# Patient Record
Sex: Male | Born: 1985
Health system: Southern US, Community
[De-identification: ages and names within clinical notes are randomized; demographics above are authoritative.]

## PROBLEM LIST (undated history)

## (undated) DIAGNOSIS — I319 Disease of pericardium, unspecified: Secondary | ICD-10-CM

## (undated) HISTORY — DX: Disease of pericardium, unspecified: I31.9

---

## 2007-10-28 ENCOUNTER — Emergency Department (HOSPITAL_COMMUNITY): Admission: EM | Admit: 2007-10-28 | Discharge: 2007-10-28 | Payer: Self-pay | Admitting: Emergency Medicine

## 2011-05-18 ENCOUNTER — Encounter: Payer: Self-pay | Admitting: Family Medicine

## 2011-05-18 ENCOUNTER — Ambulatory Visit (INDEPENDENT_AMBULATORY_CARE_PROVIDER_SITE_OTHER): Payer: 59 | Admitting: Family Medicine

## 2011-05-18 VITALS — BP 118/80 | HR 82 | Temp 98.1°F | Ht 67.0 in | Wt 211.8 lb

## 2011-05-18 DIAGNOSIS — Z131 Encounter for screening for diabetes mellitus: Secondary | ICD-10-CM

## 2011-05-18 DIAGNOSIS — M722 Plantar fascial fibromatosis: Secondary | ICD-10-CM

## 2011-05-18 DIAGNOSIS — Z13220 Encounter for screening for lipoid disorders: Secondary | ICD-10-CM

## 2011-05-18 NOTE — Progress Notes (Signed)
  Patient Name: John Ray Date of Birth: Dec 25, 1985 Age: 25 y.o. Medical Record Number: [id number] Gender: male  History of Present Illness:  John Ray is a 25 y.o. very pleasant male patient who presents with the following:  Just moved from [place]. Started to get some pain in his foot for aout six months.  Father just had another MI.   Had some concussions from college hockey and lacrosse. At [facility] [employer] - played hockey in [place].   The patient presents with a 6 month long history of heel pain. This is notable for worsening pain first thing in the morning when arising and standing after sitting.   Prior foot or ankle fractures: none Prior operations: none Orthotics or bracing: none Medications: OTC nsaids and tylenol PT or home rehab: none Night splints: no Ice massage: no Ball massage: no  Metatarsal pain: no  His father recently had a 2nd heart attack, and he would like to be checked for some basic lab work including cholesterol.  Past Medical History, Surgical History, Social History, Family History, and Problem List have been reviewed in EHR and updated if relevant.  Review of Systems:  GEN: No acute illnesses, no fevers, chills. GI: No n/v/d, eating normally Pulm: No SOB Interactive and getting along well at home.  Otherwise, ROS is as per the HPI.   Physical Examination: Filed Vitals:   05/18/11 1406  BP: 118/80  Pulse: 82  Temp: 98.1 F (36.7 C)  TempSrc: Oral  Height: 5\' 7"  (1.702 m)  Weight: 211 lb 12.8 oz (96.072 kg)  SpO2: 98%     GEN: WDWN, NAD, Non-toxic, Alert & Oriented x 3 HEENT: Atraumatic, Normocephalic.  Ears and Nose: No external deformity. EXTR: No clubbing/cyanosis/edema NEURO: Normal gait.  PSYCH: Normally interactive. Conversant. Not depressed or anxious appearing.  Calm demeanor.  Pulmonary: Clear throughout bilaterally, no wheezes, crackles, or rhonchi Cardiovascular:  Regular rate and rhythm, no murmurs, gallops, or rubs  Foot exam Echymosis: no Edema: no ROM: full LE B Gait: heel toe, non-antalgic MT pain: no Callus pattern: none Lateral Mall: NT Medial Mall: NT Talus: NT Navicular: NT Calcaneous: NT Metatarsals: NT 5th MT: NT Phalanges: NT Achilles: NT Plantar Fascia: tender, medial along PF. Pain with forced dorsi Fat Pad: NT Peroneals: NT Post Tib: NT Great Toe: Nml motion Ant Drawer: neg Other foot breakdown: none Long arch: preserved - cavus foot Transverse arch: preserved Hindfoot breakdown: none Sensation: intact   Assessment and Plan: 1. Plantar fascia syndrome    2. Screening for lipoid disorders  Lipid panel  3. Screening for diabetes mellitus  Basic metabolic panel    Plantar Fascitis: We reviewed that stretching is critically important to the treatment of PF. Reviewed footwear. Rigid soles have been shown to help with PF. Reviewed rehab of stretching and calf raises.  Could benefit from a corticosteroid injection, orthotics, or other measures if conservative treatment fails.   Refer to the patient instructions sections for details of plan shared with patient.

## 2011-05-18 NOTE — Patient Instructions (Signed)
Please read handouts on Plantar Fascitis.  STRETCHING and Strengthening program critically important.  Strengthening on foot and calf muscles as seen in handout. Calf raises, 2 legged, then 1 legged. Foot massage with tennis ball. Ice massage.  NEEDS TO BE DONE EVERY DAY  Recommended over the counter insoles. (Spenco or Hapad)  A rigid shoe with good arch support helps: Dansko (great), Lelon Frohlich --- for [employer]: Clarks, [place], [employer], [employer]kenstock No easily bendable shoes.

## 2011-05-19 ENCOUNTER — Encounter: Payer: Self-pay | Admitting: Family Medicine

## 2011-05-19 LAB — BASIC METABOLIC PANEL
BUN: 11 mg/dL (ref 6–23)
Chloride: 106 mEq/L (ref 96–112)
Glucose, Bld: 92 mg/dL (ref 70–99)
Potassium: 3.8 mEq/L (ref 3.5–5.3)

## 2011-05-19 LAB — LIPID PANEL
LDL Cholesterol: 71 mg/dL (ref 0–99)
VLDL: 72 mg/dL — ABNORMAL HIGH (ref 0–40)

## 2011-05-23 ENCOUNTER — Encounter: Payer: Self-pay | Admitting: *Deleted

## 2012-01-25 ENCOUNTER — Other Ambulatory Visit: Payer: Self-pay | Admitting: Family Medicine

## 2012-01-25 DIAGNOSIS — E781 Pure hyperglyceridemia: Secondary | ICD-10-CM

## 2012-01-25 DIAGNOSIS — R5383 Other fatigue: Secondary | ICD-10-CM

## 2012-01-30 ENCOUNTER — Other Ambulatory Visit (INDEPENDENT_AMBULATORY_CARE_PROVIDER_SITE_OTHER): Payer: Managed Care, Other (non HMO)

## 2012-01-30 DIAGNOSIS — E781 Pure hyperglyceridemia: Secondary | ICD-10-CM

## 2012-01-30 DIAGNOSIS — R5381 Other malaise: Secondary | ICD-10-CM

## 2012-01-30 LAB — LIPID PANEL
HDL: 40.3 mg/dL (ref >39.00)
Triglycerides: 171 mg/dL — ABNORMAL HIGH (ref 0.0–149.0)

## 2012-01-30 LAB — CBC WITH DIFFERENTIAL/PLATELET
Eosinophils Relative: 2.4 % (ref 0.0–5.0)
MCV: 83.7 fl (ref 78.0–100.0)
Monocytes Absolute: 0.6 10*3/uL (ref 0.1–1.0)
Monocytes Relative: 7.6 % (ref 3.0–12.0)
Neutrophils Relative %: 54.3 % (ref 43.0–77.0)
Platelets: 200 10*3/uL (ref 150.0–400.0)
WBC: 7.5 10*3/uL (ref 4.5–10.5)

## 2012-01-30 LAB — HEPATIC FUNCTION PANEL
ALT: 95 U/L — ABNORMAL HIGH (ref 0–53)
AST: 51 U/L — ABNORMAL HIGH (ref 0–37)
Albumin: 4.5 g/dL (ref 3.5–5.2)
Total Protein: 7.6 g/dL (ref 6.0–8.3)

## 2012-01-30 LAB — BASIC METABOLIC PANEL
GFR: 129.84 mL/min (ref >60.00)
Glucose, Bld: 93 mg/dL (ref 70–99)
Potassium: 4.2 mEq/L (ref 3.5–5.1)
Sodium: 141 mEq/L (ref 135–145)

## 2012-02-06 ENCOUNTER — Encounter: Payer: Self-pay | Admitting: Family Medicine

## 2012-02-06 ENCOUNTER — Ambulatory Visit (INDEPENDENT_AMBULATORY_CARE_PROVIDER_SITE_OTHER): Payer: Managed Care, Other (non HMO) | Admitting: Family Medicine

## 2012-02-06 VITALS — BP 130/84 | HR 86 | Temp 98.2°F | Ht 67.0 in | Wt 213.8 lb

## 2012-02-06 DIAGNOSIS — Z0000 Encounter for general adult medical examination without abnormal findings: Secondary | ICD-10-CM

## 2012-02-06 NOTE — Progress Notes (Signed)
[facility] at [facility] [address] [place] [address] Phone: [phone] Fax: [phone]  Date:  02/06/2012   Name:  John Ray   DOB:  May 18, 1986   MRN:  [id number] Gender: male  Age: 26 y.o.  PCP:  Hannah Beat, MD    Chief Complaint: Annual Exam   History of Present Illness:  John Ray is a 26 y.o. very pleasant male patient who presents with the following:  Preventative Health Maintenance Visit:  Health Maintenance Summary Reviewed and updated, unless pt declines services.  Tobacco History Reviewed. Alcohol: No concerns, no excessive use Exercise Habits: Some activity, rec at least 30 mins 5 times a week - going to [employer], doing some boxing workouts STD concerns: no risk or activity to increase risk Drug Use: None Encouraged self-testicular check  Health Maintenance  Topic Date Due  . Tetanus/tdap  02/15/2003  . Influenza Vaccine  03/27/2012    Labs reviewed with the patient.  Results for orders placed in visit on 01/30/12  LIPID PANEL      Component Value Range   Cholesterol 182  0 - 200 mg/dL   Triglycerides 865.7 (*) 0.0 - 149.0 mg/dL   HDL 84.69  >62.95 mg/dL   VLDL 28.4  0.0 - 13.2 mg/dL   LDL Cholesterol 440 (*) 0 - 99 mg/dL   Total CHOL/HDL Ratio 5    HEPATIC FUNCTION PANEL      Component Value Range   Total Bilirubin 0.8  0.3 - 1.2 mg/dL   Bilirubin, Direct 0.1  0.0 - 0.3 mg/dL   Alkaline Phosphatase 58  39 - 117 U/L   AST 51 (*) 0 - 37 U/L   ALT 95 (*) 0 - 53 U/L   Total Protein 7.6  6.0 - 8.3 g/dL   Albumin 4.5  3.5 - 5.2 g/dL  CBC WITH DIFFERENTIAL      Component Value Range   WBC 7.5  4.5 - 10.5 K/uL   RBC 5.22  4.22 - 5.81 Mil/uL   Hemoglobin 14.5  13.0 - 17.0 g/dL   HCT 10.2  72.5 - 36.6 %   MCV 83.7  78.0 - 100.0 fl   MCHC 33.3  30.0 - 36.0 g/dL   RDW 44.0  34.7 - 42.5 %   Platelets 200.0  150.0 - 400.0 K/uL   Neutrophils Relative 54.3  43.0 - 77.0 %   Lymphocytes  Relative 35.2  12.0 - 46.0 %   Monocytes Relative 7.6  3.0 - 12.0 %   Eosinophils Relative 2.4  0.0 - 5.0 %   Basophils Relative 0.5  0.0 - 3.0 %   Neutro Abs 4.1  1.4 - 7.7 K/uL   Lymphs Abs 2.6  0.7 - 4.0 K/uL   Monocytes Absolute 0.6  0.1 - 1.0 K/uL   Eosinophils Absolute 0.2  0.0 - 0.7 K/uL   Basophils Absolute 0.0  0.0 - 0.1 K/uL  BASIC METABOLIC PANEL      Component Value Range   Sodium 141  135 - 145 mEq/L   Potassium 4.2  3.5 - 5.1 mEq/L   Chloride 106  96 - 112 mEq/L   CO2 30  19 - 32 mEq/L   Glucose, Bld 93  70 - 99 mg/dL   BUN 14  6 - 23 mg/dL   Creatinine, Ser 0.8  0.4 - 1.5 mg/dL   Calcium 9.2  8.4 - 95.6 mg/dL   GFR 387.56  >43.32 mL/min  Has been stressed out a lot. Joined a gym and hitting a bag [employer]    Past Medical History, Surgical History, Social History, Family History, Problem List, Medications, and Allergies have been reviewed and updated if relevant.  No current outpatient prescriptions on file prior to visit.    Review of Systems:  General: Denies fever, chills, sweats. No significant weight loss. Eyes: Denies blurring,significant itching ENT: Denies earache, sore throat, and hoarseness. Cardiovascular: Denies chest pains, palpitations, dyspnea on exertion Respiratory: Denies cough, dyspnea at rest,wheeezing Breast: no concerns about lumps GI: Denies nausea, vomiting, diarrhea, constipation, change in bowel habits, abdominal pain, melena, hematochezia GU: Denies penile discharge, ED, urinary flow / outflow problems. No STD concerns. Musculoskeletal: Denies back pain, joint pain Derm: Denies rash, itching Neuro: Denies  paresthesias, frequent falls, frequent headaches Psych: Denies depression, anxiety. SOME STRESS AT WORK, 28 HOURS A WEEK AT WORK Endocrine: Denies cold intolerance, heat intolerance, polydipsia Heme: Denies enlarged lymph nodes Allergy: No hayfever   Physical Examination: Filed Vitals:   02/06/12 1426  BP: 130/84   Pulse: 86  Temp: 98.2 F (36.8 C)   Filed Vitals:   02/06/12 1426  Height: 5\' 7"  (1.702 m)  Weight: 213 lb 12 oz (96.956 kg)   Body mass index is 33.48 kg/(m^2). Ideal Body Weight: Weight in (lb) to have BMI = 25: 159.3   GEN: well developed, well nourished, no acute distress Eyes: conjunctiva and lids normal, PERRLA, EOMI ENT: TM clear, nares clear, oral exam WNL Neck: supple, no lymphadenopathy, no thyromegaly, no JVD Pulm: clear to auscultation and percussion, respiratory effort normal CV: regular rate and rhythm, S1-S2, no murmur, rub or gallop, no bruits, peripheral pulses normal and symmetric, no cyanosis, clubbing, edema or varicosities GI: soft, non-tender; no hepatosplenomegaly, masses; active bowel sounds all quadrants GU: no hernia, testicular mass, penile discharge Lymph: no cervical, axillary or inguinal adenopathy MSK: gait normal, muscle tone and strength WNL, no joint swelling, effusions, discoloration, crepitus  SKIN: clear, good turgor, color WNL, no rashes, lesions, or ulcerations Neuro: normal mental status, normal strength, sensation, and motion Psych: alert; oriented to person, place and time, normally interactive and not anxious or depressed in appearance.   Assessment and Plan:  1. Routine general medical examination at a health care facility    2. Elevated transaminase level  Hepatitis B core antibody, IgM, Hepatitis B surface antibody, Hepatitis B surface antigen, Hepatitis C antibody   The patient's preventative maintenance and recommended screening tests for an annual wellness exam were reviewed in full today. Brought up to date unless services declined.  Counselled on the importance of diet, exercise, and its role in overall health and mortality. The patient's FH and SH was reviewed, including their home life, tobacco status, and drug and alcohol status.   Elevated LFT's discussed, low risk, but need to rule out hep  Orders Today:  Orders Placed  This Encounter  Procedures  . Hepatitis B core antibody, IgM  . Hepatitis B surface antibody  . Hepatitis B surface antigen  . Hepatitis C antibody    Medications Today: (Includes new updates added during medication reconciliation) No orders of the defined types were placed in this encounter.    Medications Discontinued: There are no discontinued medications.  Labs Results from 02/06/2012 that have returned and recent labs:  Hannah Beat, MD

## 2012-02-07 LAB — HEPATITIS C ANTIBODY: HCV Ab: NEGATIVE

## 2012-02-07 LAB — HEPATITIS B SURFACE ANTIGEN: Hepatitis B Surface Ag: NEGATIVE

## 2013-02-28 ENCOUNTER — Encounter: Payer: Self-pay | Admitting: Family Medicine

## 2013-02-28 ENCOUNTER — Ambulatory Visit (INDEPENDENT_AMBULATORY_CARE_PROVIDER_SITE_OTHER): Payer: Managed Care, Other (non HMO) | Admitting: Family Medicine

## 2013-02-28 VITALS — BP 128/100 | HR 82 | Temp 98.2°F | Ht 67.25 in | Wt 211.5 lb

## 2013-02-28 DIAGNOSIS — Z131 Encounter for screening for diabetes mellitus: Secondary | ICD-10-CM

## 2013-02-28 DIAGNOSIS — Z0000 Encounter for general adult medical examination without abnormal findings: Secondary | ICD-10-CM

## 2013-02-28 DIAGNOSIS — R5381 Other malaise: Secondary | ICD-10-CM

## 2013-02-28 DIAGNOSIS — Z13220 Encounter for screening for lipoid disorders: Secondary | ICD-10-CM

## 2013-02-28 LAB — HEPATIC FUNCTION PANEL
ALT: 61 U/L — ABNORMAL HIGH (ref 0–53)
AST: 34 U/L (ref 0–37)
Alkaline Phosphatase: 58 U/L (ref 39–117)
Bilirubin, Direct: 0 mg/dL (ref 0.0–0.3)
Total Protein: 7.8 g/dL (ref 6.0–8.3)

## 2013-02-28 LAB — CBC WITH DIFFERENTIAL/PLATELET
Basophils Relative: 0.5 % (ref 0.0–3.0)
Eosinophils Relative: 2.3 % (ref 0.0–5.0)
Lymphocytes Relative: 37.6 % (ref 12.0–46.0)
MCV: 82.8 fl (ref 78.0–100.0)
Monocytes Relative: 8.2 % (ref 3.0–12.0)
Neutrophils Relative %: 51.4 % (ref 43.0–77.0)
RBC: 5.41 Mil/uL (ref 4.22–5.81)
WBC: 8.2 10*3/uL (ref 4.5–10.5)

## 2013-02-28 LAB — BASIC METABOLIC PANEL
Calcium: 9.5 mg/dL (ref 8.4–10.5)
Chloride: 101 mEq/L (ref 96–112)
Creatinine, Ser: 0.8 mg/dL (ref 0.4–1.5)
GFR: 121.44 mL/min (ref >60.00)

## 2013-02-28 LAB — LIPID PANEL: Total CHOL/HDL Ratio: 5

## 2013-02-28 NOTE — Progress Notes (Signed)
[facility] at [facility] [address] [place] [address] Phone: [phone] Fax: [phone]  Date:  02/28/2013   Name:  John Ray   DOB:  05/21/1986   MRN:  [id number] Gender: male Age: 27 y.o.  Primary Physician:  Hannah Beat, MD  Evaluating MD: Hannah Beat, MD   Chief Complaint: Annual Exam   History of Present Illness:  John Ray is a 27 y.o. pleasant patient who presents with the following:  CPX:  Wt Readings from Last 3 Encounters:  02/28/13 211 lb 8 oz (95.936 kg)  02/06/12 213 lb 12 oz (96.956 kg)  05/18/11 211 lb 12.8 oz (96.072 kg)   Preventative Health Maintenance Visit:  Health Maintenance Summary Reviewed and updated, unless pt declines services.  Tobacco History Reviewed. Alcohol: No concerns, no excessive use Exercise Habits: Some activity, rec at least 30 mins 5 times a week (about 2 times a month now) STD concerns: no risk or activity to increase risk Drug Use: None Encouraged self-testicular chec   There are no active problems to display for this patient.   Past Medical History  Diagnosis Date  . Pericarditis   . Concussion     x 2 playing college hockey and lacrosse    No past surgical history on file.  History   Social History  . Marital Status: Married    Spouse Name: N/A    Number of Children: N/A  . Years of Education: N/A   Occupational History  . Not on file.   Social History Main Topics  . Smoking status: Never Smoker   . Smokeless tobacco: Former Neurosurgeon  . Alcohol Use: Yes  . Drug Use: No  . Sexual Activity: Not on file   Other Topics Concern  . Not on file   Social History Narrative  . No narrative on file    No family history on file.  No Known Allergies  Medication list has been reviewed and updated.  No outpatient prescriptions prior to visit.   No facility-administered medications prior to visit.    Review of Systems:   General:  Denies fever, chills, sweats. No significant weight loss. Eyes: Denies blurring,significant itching ENT: Denies earache, sore throat, and hoarseness. Cardiovascular: Denies chest pains, palpitations, dyspnea on exertion Respiratory: Denies cough, dyspnea at rest,wheeezing Breast: no concerns about lumps GI: Denies nausea, vomiting, diarrhea, constipation, change in bowel habits, abdominal pain, melena, hematochezia GU: Denies penile discharge, ED, urinary flow / outflow problems. No STD concerns. Musculoskeletal: Denies back pain, joint pain Derm: Denies rash, itching Neuro: Denies  paresthesias, frequent falls, frequent headaches Psych: Denies depression, anxiety Endocrine: Denies cold intolerance, heat intolerance, polydipsia Heme: Denies enlarged lymph nodes Allergy: No hayfever   Physical Examination: BP 128/100  Pulse 82  Temp(Src) 98.2 F (36.8 C) (Oral)  Ht 5' 7.25" (1.708 m)  Wt 211 lb 8 oz (95.936 kg)  BMI 32.89 kg/m2  Ideal Body Weight: Weight in (lb) to have BMI = 25: 160.5   Wt Readings from Last 3 Encounters:  02/28/13 211 lb 8 oz (95.936 kg)  02/06/12 213 lb 12 oz (96.956 kg)  05/18/11 211 lb 12.8 oz (96.072 kg)    GEN: well developed, well nourished, no acute distress Eyes: conjunctiva and lids normal, PERRLA, EOMI ENT: TM clear, nares clear, oral exam WNL Neck: supple, no lymphadenopathy, no thyromegaly, no JVD Pulm: clear to auscultation and percussion, respiratory effort normal CV: regular rate and rhythm, S1-S2, no murmur, rub or  gallop, no bruits, peripheral pulses normal and symmetric, no cyanosis, clubbing, edema or varicosities Chest: no scars, masses GI: soft, non-tender; no hepatosplenomegaly, masses; active bowel sounds all quadrants GU: no hernia, testicular mass, penile discharge Lymph: no cervical, axillary or inguinal adenopathy MSK: gait normal, muscle tone and strength WNL, no joint swelling, effusions, discoloration, crepitus  SKIN:  clear, good turgor, color WNL, no rashes, lesions, or ulcerations Neuro: normal mental status, normal strength, sensation, and motion Psych: alert; oriented to person, place and time, normally interactive and not anxious or depressed in appearance.   Assessment and Plan:  Routine general medical examination at a health care facility  Screening for lipoid disorders - Plan: Lipid panel  Screening for diabetes mellitus - Plan: Basic metabolic panel  Other malaise and fatigue - Plan: CBC with Differential, Hepatic function panel  The patient's preventative maintenance and recommended screening tests for an annual wellness exam were reviewed in full today. Brought up to date unless services declined.  Counselled on the importance of diet, exercise, and its role in overall health and mortality. The patient's FH and SH was reviewed, including their home life, tobacco status, and drug and alcohol status.   Doing well, work on more exercise.  Orders Today:  Orders Placed This Encounter  Procedures  . Basic metabolic panel  . CBC with Differential  . Hepatic function panel  . Lipid panel    Updated Medication List: (Includes new medications, updates to list, dose adjustments) No orders of the defined types were placed in this encounter.    Medications Discontinued: There are no discontinued medications.    Signed, Elpidio Galea. Aqeel Norgaard, MD 02/28/2013 2:26 PM

## 2013-03-01 ENCOUNTER — Encounter: Payer: Self-pay | Admitting: *Deleted

## 2013-12-06 ENCOUNTER — Encounter: Payer: Self-pay | Admitting: Family Medicine

## 2013-12-06 ENCOUNTER — Ambulatory Visit (INDEPENDENT_AMBULATORY_CARE_PROVIDER_SITE_OTHER): Payer: Managed Care, Other (non HMO) | Admitting: Family Medicine

## 2013-12-06 VITALS — BP 138/80 | HR 72 | Temp 98.1°F | Ht 67.5 in | Wt 215.5 lb

## 2013-12-06 DIAGNOSIS — M25532 Pain in left wrist: Secondary | ICD-10-CM

## 2013-12-06 DIAGNOSIS — M25539 Pain in unspecified wrist: Secondary | ICD-10-CM

## 2013-12-06 DIAGNOSIS — G5620 Lesion of ulnar nerve, unspecified upper limb: Secondary | ICD-10-CM

## 2013-12-06 NOTE — Progress Notes (Signed)
[address] [place] [id number] Phone: [phone] Fax: [phone]  Patient ID: John NoseChristopher Michael Kirsten MRN: [id number], DOB: July 08, 1985, 28 y.o. Date of Encounter: 12/06/2013  Primary Physician:  Hannah BeatSpencer Christoph Copelan, MD   Chief Complaint: Hand Pain   Subjective:   History of Present Illness:  John NoseChristopher Michael Ray is a 28 y.o. very pleasant male patient who presents with the following:  Left hand has been hurting. Was on vacation last week and playing around with [place] in the [place]. Did not lift last week. Has not played lacrosse.  Left lateral hand tingling, some up arm. He has not been having any calf pain in his neck, shoulder blade, or trapezius region. He has been having some tingling and altered sensations on the 4th and 5th digits on the LEFT hand. He is also noticed that his grip strength is decreased on the LEFT compared to the RIGHT. He was surfing and rodding of the keyboard quite a bit with his kids last week. This all happened before the initiation of his symptoms.  He has never had any kind of similar sensation or any significant history of neck or problems at this elbow or wrist other than typical aches and pains that go along with hockey and lacrosse.  L arm.  Ulnar nerve, guyon's canal.  Past Medical History, Surgical History, Social History, Family History, Problem List, Medications, and Allergies have been reviewed and updated if relevant.  Review of Systems:  GEN: No fevers, chills. Nontoxic. Primarily MSK c/o today. MSK: Detailed in the HPI GI: tolerating PO intake without difficulty Neuro: as above. Otherwise the pertinent positives of the ROS are noted above.   Objective:   Physical Examination: BP 138/80  Pulse 72  Temp(Src) 98.1 F (36.7 C) (Oral)  Ht 5' 7.5" (1.715 m)  Wt 215 lb 8 oz (97.75 kg)  BMI 33.23 kg/m2   GEN: WDWN, NAD, Non-toxic, Alert & Oriented x 3 HEENT: Atraumatic, Normocephalic.  Ears and Nose: No external  deformity. EXTR: No clubbing/cyanosis/edema NEURO: Normal gait.  PSYCH: Normally interactive. Conversant. Not depressed or anxious appearing.  Calm demeanor.    Full range of motion at the elbow. Strength is preserved in all directions. Full range of motion at the shoulder.  CERVICAL SPINE EXAM Range of motion: Flexion, extension, lateral bending, and rotation: full Pain with terminal motion: no Spinous Processes: NT SCM: NT Upper paracervical muscles: nt Upper traps: NT  At the hand and wrist on the LEFT the patient is nontender throughout all fingers, nontender at the MCP joint, nontender at the metacarpals. He is nontender along the ulna and radius. Axial loading does cause pain. Axial deviation towards the radial side causes minimal pain, and axial radiation towards the ulnar side does cause him pain. His grip is diminished on the LEFT compared to the RIGHT. Sensation is slightly altered to pinprick on the 4th and 5th. Deep tendon reflexes are intact and 2+.  Minimally to non-provoke a little symptoms at the elbow when Tinel's at the elbow is attempted.  Radiology: No results found.  Assessment & Plan:   Ulnar neuropathy at wrist  Wrist pain, left  He certainly has some wrist pain, he may have had a wrist sprain on some level. It is possible that a TFCC could have been injured as well, and the patient also has been highly active playing lacrosse and hockey for years, so he may have some negative changes in his wrist.  Nevertheless, I think that his ulnar  nerve is likely being compressed in his wrist and this is probably driving his symptoms. I am going to place him in a cockup wrist splint for the next 3 weeks. Aleve 440 mg p.o. B.i.d.  If he is still having persistent symptoms, then additional workup to assess the source of the nerve impairment would make sense.  Follow-up: No Follow-up on file. Unless noted above, the patient is to follow-up if symptoms worsen. Red flags were  reviewed with the patient.  Signed,  Elpidio GaleaSpencer T. Viola Kinnick, MD, CAQ Sports Medicine   Discontinued Medications   No medications on file   Current Medications at Discharge:   Medication List    Notice As of 12/06/2013 11:59 PM   You have not been prescribed any medications.

## 2013-12-06 NOTE — Progress Notes (Signed)
Pre visit review using our clinic review tool, if applicable. No additional management support is needed unless otherwise documented below in the visit note. 

## 2014-02-10 ENCOUNTER — Ambulatory Visit (INDEPENDENT_AMBULATORY_CARE_PROVIDER_SITE_OTHER): Payer: Managed Care, Other (non HMO) | Admitting: Internal Medicine

## 2014-02-10 ENCOUNTER — Encounter: Payer: Self-pay | Admitting: Internal Medicine

## 2014-02-10 VITALS — BP 124/82 | HR 68 | Temp 98.0°F | Ht 67.5 in | Wt 215.5 lb

## 2014-02-10 DIAGNOSIS — L678 Other hair color and hair shaft abnormalities: Secondary | ICD-10-CM

## 2014-02-10 DIAGNOSIS — L738 Other specified follicular disorders: Secondary | ICD-10-CM

## 2014-02-10 DIAGNOSIS — E785 Hyperlipidemia, unspecified: Secondary | ICD-10-CM

## 2014-02-10 DIAGNOSIS — Z0000 Encounter for general adult medical examination without abnormal findings: Secondary | ICD-10-CM

## 2014-02-10 DIAGNOSIS — L739 Follicular disorder, unspecified: Secondary | ICD-10-CM

## 2014-02-10 DIAGNOSIS — B353 Tinea pedis: Secondary | ICD-10-CM

## 2014-02-10 LAB — LIPID PANEL
CHOL/HDL RATIO: 7
CHOLESTEROL: 188 mg/dL (ref 0–200)
HDL: 26.7 mg/dL — ABNORMAL LOW (ref >39.00)
NONHDL: 161.3
TRIGLYCERIDES: 696 mg/dL — AB (ref 0.0–149.0)
VLDL: 139.2 mg/dL — AB (ref 0.0–40.0)

## 2014-02-10 LAB — COMPREHENSIVE METABOLIC PANEL
ALK PHOS: 59 U/L (ref 39–117)
ALT: 60 U/L — ABNORMAL HIGH (ref 0–53)
AST: 32 U/L (ref 0–37)
Albumin: 4.2 g/dL (ref 3.5–5.2)
BILIRUBIN TOTAL: 0.5 mg/dL (ref 0.2–1.2)
BUN: 14 mg/dL (ref 6–23)
CO2: 28 mEq/L (ref 19–32)
Calcium: 9 mg/dL (ref 8.4–10.5)
Chloride: 104 mEq/L (ref 96–112)
Creatinine, Ser: 0.9 mg/dL (ref 0.4–1.5)
GFR: 101.41 mL/min (ref >60.00)
GLUCOSE: 86 mg/dL (ref 70–99)
Potassium: 4.2 mEq/L (ref 3.5–5.1)
SODIUM: 139 meq/L (ref 135–145)
TOTAL PROTEIN: 7.1 g/dL (ref 6.0–8.3)

## 2014-02-10 LAB — CBC
HEMATOCRIT: 43.5 % (ref 39.0–52.0)
Hemoglobin: 14.6 g/dL (ref 13.0–17.0)
MCHC: 33.6 g/dL (ref 30.0–36.0)
MCV: 82.6 fl (ref 78.0–100.0)
Platelets: 205 10*3/uL (ref 150.0–400.0)
RBC: 5.27 Mil/uL (ref 4.22–5.81)
RDW: 12.7 % (ref 11.5–15.5)
WBC: 6.5 10*3/uL (ref 4.0–10.5)

## 2014-02-10 LAB — LDL CHOLESTEROL, DIRECT: Direct LDL: 83.6 mg/dL

## 2014-02-10 MED ORDER — MUPIROCIN 2 % EX OINT: 1.0000 "application " | TOPICAL_OINTMENT | Freq: Two times a day (BID) | CUTANEOUS | Status: DC

## 2014-02-10 NOTE — Progress Notes (Signed)
Subjective:    Patient ID: John Ray, male    DOB: [birth year], 28 y.o.   MRN: [id number]  HPI  Pt presents to the clinic today for his annual physical.  Flu: never Tetanus: < 10 years ago Dentist: Biannually  Review of Systems      Past Medical History  Diagnosis Date  . Pericarditis   . Concussion     x 2 playing college hockey and lacrosse    No current outpatient prescriptions on file.   No current facility-administered medications for this visit.    No Known Allergies  No family history on file.  History   Social History  . Marital Status: Married    Spouse Name: N/A    Number of Children: N/A  . Years of Education: N/A   Occupational History  . Not on file.   Social History Main Topics  . Smoking status: Never Smoker   . Smokeless tobacco: Former [employer]surgeon[employer]  . Alcohol Use: Yes  . Drug Use: No  . Sexual Activity: Not on file   Other Topics Concern  . Not on file   Social History Narrative  . No narrative on file     Constitutional: Denies fever, malaise, fatigue, headache or abrupt weight changes.  HEENT: Denies eye pain, eye redness, ear pain, ringing in the ears, wax buildup, runny nose, nasal congestion, bloody nose, or sore throat. Respiratory: Denies difficulty breathing, shortness of breath, cough or sputum production.   Cardiovascular: Denies chest pain, chest tightness, palpitations or swelling in the hands or feet.  Gastrointestinal: Denies abdominal pain, bloating, constipation, diarrhea or blood in the stool.  GU: Denies urgency, frequency, pain with urination, burning sensation, blood in urine, odor or discharge. Musculoskeletal: Denies decrease in range of motion, difficulty with gait, muscle pain or joint pain and swelling.  Skin: Pt reports rash on neck and bottom of feet. Denies lesions or ulcercations.  Neurological: Denies dizziness, difficulty with memory, difficulty with speech or problems with balance and  coordination.   No other specific complaints in a complete review of systems (except as listed in HPI above).  Objective:   Physical Exam   BP 124/82  Pulse 68  Temp(Src) 98 F (36.7 C) (Oral)  Ht 5' 7.5" (1.715 m)  Wt 215 lb 8 oz (97.75 kg)  BMI 33.23 kg/m2  SpO2 98% Wt Readings from Last 3 Encounters:  02/10/14 215 lb 8 oz (97.75 kg)  12/06/13 215 lb 8 oz (97.75 kg)  02/28/13 211 lb 8 oz (95.936 kg)    General: Appears his stated age, well developed, well nourished in NAD. Skin: Warm, dry and intact. Folliculitis noted on anterior neck. Fungal infection noted on plantar aspect of bilateral feet, L>R. HEENT: Head: normal shape and size; Eyes: sclera white, no icterus, conjunctiva pink; Ears: Tm's gray and intact, normal light reflex; Throat/Mouth: Teeth present, mucosa pink and moist, no exudate, lesions or ulcerations noted.  Neck:  Neck supple, trachea midline. No masses, lumps or thyromegaly present.  Cardiovascular: Normal rate and rhythm. S1,S2 noted.  No murmur, rubs or gallops noted. No JVD or BLE edema. No carotid bruits noted. Pulmonary/Chest: Normal effort and positive vesicular breath sounds. No respiratory distress. No wheezes, rales or ronchi noted.  Abdomen: Soft and nontender. Normal bowel sounds, no bruits noted. No distention or masses noted. Liver, spleen and kidneys non palpable. Musculoskeletal: Normal range of motion. No difficulty with gait.  Neurological: Alert and oriented. Cranial nerves II-XII grossly intact.  Psychiatric: Mood and affect normal. Behavior is normal. Judgment and thought content normal.     BMET    Component Value Date/Time   NA 137 02/28/2013 1449   K 4.0 02/28/2013 1449   CL 101 02/28/2013 1449   CO2 32 02/28/2013 1449   GLUCOSE 88 02/28/2013 1449   BUN 10 02/28/2013 1449   CREATININE 0.8 02/28/2013 1449   CREATININE 0.82 05/18/2011 1456   CALCIUM 9.5 02/28/2013 1449    Lipid Panel     Component Value Date/Time   CHOL 178 02/28/2013 1449     TRIG 164.0* 02/28/2013 1449   HDL 35.80* 02/28/2013 1449   CHOLHDL 5 02/28/2013 1449   VLDL 32.8 02/28/2013 1449   LDLCALC 109* 02/28/2013 1449    CBC    Component Value Date/Time   WBC 8.2 02/28/2013 1449   RBC 5.41 02/28/2013 1449   HGB 14.9 02/28/2013 1449   HCT 44.8 02/28/2013 1449   PLT 237.0 02/28/2013 1449   MCV 82.8 02/28/2013 1449   MCHC 33.3 02/28/2013 1449   RDW 12.3 02/28/2013 1449   LYMPHSABS 3.1 02/28/2013 1449   MONOABS 0.7 02/28/2013 1449   EOSABS 0.2 02/28/2013 1449   BASOSABS 0.0 02/28/2013 1449    Hgb A1C No results found for this basename: HGBA1C        Assessment & Plan:   Preventative Health Maintenance:  Will obtain CBC, CMET and lipid profile today Discussed flu vaccine for this fall- he declines Wellness form filled out for work  Folliculitis:  Advised him to hold off shaving for a few days if he can Warm compresses to the area TID eRx for Bactroban BID prn  Advised him to use good shaving cream and sharp razor every time he shaves  Tinea Pedis:  He would like to hold off on prescription cream for now He would like to try OTC stuff first  RTC in 1 year or sooner if needed

## 2014-02-10 NOTE — Patient Instructions (Addendum)
Preventive Care for Adults A healthy lifestyle and preventive care can promote health and wellness. Preventive health guidelines for men include the following key practices:  A routine yearly physical is a good way to check with your health care provider about your health and preventative screening. It is a chance to share any concerns and updates on your health and to receive a thorough exam.  Visit your dentist for a routine exam and preventative care every 6 months. Brush your teeth twice a day and floss once a day. Good oral hygiene prevents tooth decay and gum disease.  The frequency of eye exams is based on your age, health, family medical history, use of contact lenses, and other factors. Follow your health care provider's recommendations for frequency of eye exams.  Eat a healthy diet. Foods such as vegetables, fruits, whole grains, low-fat dairy products, and lean protein foods contain the nutrients you need without too many calories. Decrease your intake of foods high in solid fats, added sugars, and salt. Eat the right amount of calories for you.Get information about a proper diet from your health care provider, if necessary.  Regular physical exercise is one of the most important things you can do for your health. Most adults should get at least 150 minutes of moderate-intensity exercise (any activity that increases your heart rate and causes you to sweat) each week. In addition, most adults need muscle-strengthening exercises on 2 or more days a week.  Maintain a healthy weight. The body mass index (BMI) is a screening tool to identify possible weight problems. It provides an estimate of body fat based on height and weight. Your health care provider can find your BMI and can help you achieve or maintain a healthy weight.For adults 20 years and older:  A BMI below 18.5 is considered underweight.  A BMI of 18.5 to 24.9 is normal.  A BMI of 25 to 29.9 is considered overweight.  A BMI  of 30 and above is considered obese.  Maintain normal blood lipids and cholesterol levels by exercising and minimizing your intake of saturated fat. Eat a balanced diet with plenty of fruit and vegetables. Blood tests for lipids and cholesterol should begin at age 50 and be repeated every 5 years. If your lipid or cholesterol levels are high, you are over 50, or you are at high risk for heart disease, you may need your cholesterol levels checked more frequently.Ongoing high lipid and cholesterol levels should be treated with medicines if diet and exercise are not working.  If you smoke, find out from your health care provider how to quit. If you do not use tobacco, do not start.  Lung cancer screening is recommended for adults aged 73-80 years who are at high risk for developing lung cancer because of a history of smoking. A yearly low-dose CT scan of the lungs is recommended for people who have at least a 30-pack-year history of smoking and are a current smoker or have quit within the past 15 years. A pack year of smoking is smoking an average of 1 pack of cigarettes a day for 1 year (for example: 1 pack a day for 30 years or 2 packs a day for 15 years). Yearly screening should continue until the smoker has stopped smoking for at least 15 years. Yearly screening should be stopped for people who develop a health problem that would prevent them from having lung cancer treatment.  If you choose to drink alcohol, do not have more than  2 drinks per day. One drink is considered to be 12 ounces (355 mL) of beer, 5 ounces (148 mL) of wine, or 1.5 ounces (44 mL) of liquor.  Avoid use of street drugs. Do not share needles with anyone. Ask for help if you need support or instructions about stopping the use of drugs.  High blood pressure causes heart disease and increases the risk of stroke. Your blood pressure should be checked at least every 1-2 years. Ongoing high blood pressure should be treated with  medicines, if weight loss and exercise are not effective.  If you are 45-79 years old, ask your health care provider if you should take aspirin to prevent heart disease.  Diabetes screening involves taking a blood sample to check your fasting blood sugar level. This should be done once every 3 years, after age 45, if you are within normal weight and without risk factors for diabetes. Testing should be considered at a younger age or be carried out more frequently if you are overweight and have at least 1 risk factor for diabetes.  Colorectal cancer can be detected and often prevented. Most routine colorectal cancer screening begins at the age of 50 and continues through age 75. However, your health care provider may recommend screening at an earlier age if you have risk factors for colon cancer. On a yearly basis, your health care provider may provide home test kits to check for hidden blood in the stool. Use of a small camera at the end of a tube to directly examine the colon (sigmoidoscopy or colonoscopy) can detect the earliest forms of colorectal cancer. Talk to your health care provider about this at age 50, when routine screening begins. Direct exam of the colon should be repeated every 5-10 years through age 75, unless early forms of precancerous polyps or small growths are found.  People who are at an increased risk for hepatitis B should be screened for this virus. You are considered at high risk for hepatitis B if:  You were born in a country where hepatitis B occurs often. Talk with your health care provider about which countries are considered high risk.  Your parents were born in a high-risk country and you have not received a shot to protect against hepatitis B (hepatitis B vaccine).  You have HIV or AIDS.  You use needles to inject street drugs.  You live with, or have sex with, someone who has hepatitis B.  You are a man who has sex with other men (MSM).  You get hemodialysis  treatment.  You take certain medicines for conditions such as cancer, organ transplantation, and autoimmune conditions.  Hepatitis C blood testing is recommended for all people born from 1945 through 1965 and any individual with known risks for hepatitis C.  Practice safe sex. Use condoms and avoid high-risk sexual practices to reduce the spread of sexually transmitted infections (STIs). STIs include gonorrhea, chlamydia, syphilis, trichomonas, herpes, HPV, and human immunodeficiency virus (HIV). Herpes, HIV, and HPV are viral illnesses that have no cure. They can result in disability, cancer, and death.  If you are at risk of being infected with HIV, it is recommended that you take a prescription medicine daily to prevent HIV infection. This is called preexposure prophylaxis (PrEP). You are considered at risk if:  You are a man who has sex with other men (MSM) and have other risk factors.  You are a heterosexual man, are sexually active, and are at increased risk for HIV infection.    You take drugs by injection.  You are sexually active with a partner who has HIV.  Talk with your health care provider about whether you are at high risk of being infected with HIV. If you choose to begin PrEP, you should first be tested for HIV. You should then be tested every 3 months for as long as you are taking PrEP.  A one-time screening for abdominal aortic aneurysm (AAA) and surgical repair of large AAAs by ultrasound are recommended for men ages 32 to 67 years who are current or former smokers.  Healthy men should no longer receive prostate-specific antigen (PSA) blood tests as part of routine cancer screening. Talk with your health care provider about prostate cancer screening.  Testicular cancer screening is not recommended for adult males who have no symptoms. Screening includes self-exam, a health care provider exam, and other screening tests. Consult with your health care provider about any symptoms  you have or any concerns you have about testicular cancer.  Use sunscreen. Apply sunscreen liberally and repeatedly throughout the day. You should seek shade when your shadow is shorter than you. Protect yourself by wearing long sleeves, pants, a wide-brimmed hat, and sunglasses year round, whenever you are outdoors.  Once a month, do a whole-body skin exam, using a mirror to look at the skin on your back. Tell your health care provider about new moles, moles that have irregular borders, moles that are larger than a pencil eraser, or moles that have changed in shape or color.  Stay current with required vaccines (immunizations).  Influenza vaccine. All adults should be immunized every year.  Tetanus, diphtheria, and acellular pertussis (Td, Tdap) vaccine. An adult who has not previously received Tdap or who does not know his vaccine status should receive 1 dose of Tdap. This initial dose should be followed by tetanus and diphtheria toxoids (Td) booster doses every 10 years. Adults with an unknown or incomplete history of completing a 3-dose immunization series with Td-containing vaccines should begin or complete a primary immunization series including a Tdap dose. Adults should receive a Td booster every 10 years.  Varicella vaccine. An adult without evidence of immunity to varicella should receive 2 doses or a second dose if he has previously received 1 dose.  Human papillomavirus (HPV) vaccine. Males aged 68-21 years who have not received the vaccine previously should receive the 3-dose series. Males aged 22-26 years may be immunized. Immunization is recommended through the age of 6 years for any male who has sex with males and did not get any or all doses earlier. Immunization is recommended for any person with an immunocompromised condition through the age of 49 years if he did not get any or all doses earlier. During the 3-dose series, the second dose should be obtained 4-8 weeks after the first  dose. The third dose should be obtained 24 weeks after the first dose and 16 weeks after the second dose.  Zoster vaccine. One dose is recommended for adults aged 50 years or older unless certain conditions are present.  Measles, mumps, and rubella (MMR) vaccine. Adults born before [birth year] generally are considered immune to measles and mumps. Adults born in [birth year] or later should have 1 or more doses of MMR vaccine unless there is a contraindication to the vaccine or there is laboratory evidence of immunity to each of the three diseases. A routine second dose of MMR vaccine should be obtained at least 28 days after the first dose for students attending postsecondary  schools, health care workers, or international travelers. People who received inactivated measles vaccine or an unknown type of measles vaccine during 1963-1967 should receive 2 doses of MMR vaccine. People who received inactivated mumps vaccine or an unknown type of mumps vaccine before 1979 and are at high risk for mumps infection should consider immunization with 2 doses of MMR vaccine. Unvaccinated health care workers born before 1957 who lack laboratory evidence of measles, mumps, or rubella immunity or laboratory confirmation of disease should consider measles and mumps immunization with 2 doses of MMR vaccine or rubella immunization with 1 dose of MMR vaccine.  Pneumococcal 13-valent conjugate (PCV13) vaccine. When indicated, a person who is uncertain of his immunization history and has no record of immunization should receive the PCV13 vaccine. An adult aged 19 years or older who has certain medical conditions and has not been previously immunized should receive 1 dose of PCV13 vaccine. This PCV13 should be followed with a dose of pneumococcal polysaccharide (PPSV23) vaccine. The PPSV23 vaccine dose should be obtained at least 8 weeks after the dose of PCV13 vaccine. An adult aged 19 years or older who has certain medical conditions and  previously received 1 or more doses of PPSV23 vaccine should receive 1 dose of PCV13. The PCV13 vaccine dose should be obtained 1 or more years after the last PPSV23 vaccine dose.  Pneumococcal polysaccharide (PPSV23) vaccine. When PCV13 is also indicated, PCV13 should be obtained first. All adults aged 65 years and older should be immunized. An adult younger than age 65 years who has certain medical conditions should be immunized. Any person who resides in a nursing home or long-term care facility should be immunized. An adult smoker should be immunized. People with an immunocompromised condition and certain other conditions should receive both PCV13 and PPSV23 vaccines. People with human immunodeficiency virus (HIV) infection should be immunized as soon as possible after diagnosis. Immunization during chemotherapy or radiation therapy should be avoided. Routine use of PPSV23 vaccine is not recommended for American Indians, [place] Natives, or people younger than 65 years unless there are medical conditions that require PPSV23 vaccine. When indicated, people who have unknown immunization and have no record of immunization should receive PPSV23 vaccine. One-time revaccination 5 years after the first dose of PPSV23 is recommended for people aged 19-64 years who have chronic kidney failure, nephrotic syndrome, asplenia, or immunocompromised conditions. People who received 1-2 doses of PPSV23 before age 65 years should receive another dose of PPSV23 vaccine at age 65 years or later if at least 5 years have passed since the previous dose. Doses of PPSV23 are not needed for people immunized with PPSV23 at or after age 65 years.  Meningococcal vaccine. Adults with asplenia or persistent complement component deficiencies should receive 2 doses of quadrivalent meningococcal conjugate (MenACWY-D) vaccine. The doses should be obtained at least 2 months apart. Microbiologists working with certain meningococcal bacteria,  military recruits, people at risk during an outbreak, and people who travel to or live in countries with a high rate of meningitis should be immunized. A first-year college student up through age 21 years who is living in a residence hall should receive a dose if he did not receive a dose on or after his 16th birthday. Adults who have certain high-risk conditions should receive one or more doses of vaccine.  Hepatitis A vaccine. Adults who wish to be protected from this disease, have certain high-risk conditions, work with hepatitis A-infected animals, work in hepatitis A research labs, or   travel to or work in countries with a high rate of hepatitis A should be immunized. Adults who were previously unvaccinated and who anticipate close contact with an international adoptee during the first 60 days after arrival in the [place] from a country with a high rate of hepatitis A should be immunized.  Hepatitis B vaccine. Adults should be immunized if they wish to be protected from this disease, have certain high-risk conditions, may be exposed to blood or other infectious body fluids, are household contacts or sex partners of hepatitis B positive people, are clients or workers in certain care facilities, or travel to or work in countries with a high rate of hepatitis B.  Haemophilus influenzae type b (Hib) vaccine. A previously unvaccinated person with asplenia or sickle cell disease or having a scheduled splenectomy should receive 1 dose of Hib vaccine. Regardless of previous immunization, a recipient of a hematopoietic stem cell transplant should receive a 3-dose series 6-12 months after his successful transplant. Hib vaccine is not recommended for adults with HIV infection. Preventive Service / Frequency Ages 52 to 17  Blood pressure check.** / Every 1 to 2 years.  Lipid and cholesterol check.** / Every 5 years beginning at age 69.  Hepatitis C blood test.** / For any individual with known risks for  hepatitis C.  Skin self-exam. / Monthly.  Influenza vaccine. / Every year.  Tetanus, diphtheria, and acellular pertussis (Tdap, Td) vaccine.** / Consult your health care provider. 1 dose of Td every 10 years.  Varicella vaccine.** / Consult your health care provider.  HPV vaccine. / 3 doses over 6 months, if 72 or younger.  Measles, mumps, rubella (MMR) vaccine.** / You need at least 1 dose of MMR if you were born in 1957 or later. You may also need a second dose.  Pneumococcal 13-valent conjugate (PCV13) vaccine.** / Consult your health care provider.  Pneumococcal polysaccharide (PPSV23) vaccine.** / 1 to 2 doses if you smoke cigarettes or if you have certain conditions.  Meningococcal vaccine.** / 1 dose if you are age 35 to 60 years and a Market researcher living in a residence hall, or have one of several medical conditions. You may also need additional booster doses.  Hepatitis A vaccine.** / Consult your health care provider.  Hepatitis B vaccine.** / Consult your health care provider.  Haemophilus influenzae type b (Hib) vaccine.** / Consult your health care provider. Ages 35 to 8  Blood pressure check.** / Every 1 to 2 years.  Lipid and cholesterol check.** / Every 5 years beginning at age 57.  Lung cancer screening. / Every year if you are aged 44-80 years and have a 30-pack-year history of smoking and currently smoke or have quit within the past 15 years. Yearly screening is stopped once you have quit smoking for at least 15 years or develop a health problem that would prevent you from having lung cancer treatment.  Fecal occult blood test (FOBT) of stool. / Every year beginning at age 55 and continuing until age 73. You may not have to do this test if you get a colonoscopy every 10 years.  Flexible sigmoidoscopy** or colonoscopy.** / Every 5 years for a flexible sigmoidoscopy or every 10 years for a colonoscopy beginning at age 28 and continuing until age  1.  Hepatitis C blood test.** / For all people born from [birth year] through 1965 and any individual with known risks for hepatitis C.  Skin self-exam. / Monthly.  Influenza vaccine. / Every  year.  Tetanus, diphtheria, and acellular pertussis (Tdap/Td) vaccine.** / Consult your health care provider. 1 dose of Td every 10 years.  Varicella vaccine.** / Consult your health care provider.  Zoster vaccine.** / 1 dose for adults aged 25 years or older.  Measles, mumps, rubella (MMR) vaccine.** / You need at least 1 dose of MMR if you were born in 1957 or later. You may also need a second dose.  Pneumococcal 13-valent conjugate (PCV13) vaccine.** / Consult your health care provider.  Pneumococcal polysaccharide (PPSV23) vaccine.** / 1 to 2 doses if you smoke cigarettes or if you have certain conditions.  Meningococcal vaccine.** / Consult your health care provider.  Hepatitis A vaccine.** / Consult your health care provider.  Hepatitis B vaccine.** / Consult your health care provider.  Haemophilus influenzae type b (Hib) vaccine.** / Consult your health care provider. Ages 55 and over  Blood pressure check.** / Every 1 to 2 years.  Lipid and cholesterol check.**/ Every 5 years beginning at age 46.  Lung cancer screening. / Every year if you are aged 62-80 years and have a 30-pack-year history of smoking and currently smoke or have quit within the past 15 years. Yearly screening is stopped once you have quit smoking for at least 15 years or develop a health problem that would prevent you from having lung cancer treatment.  Fecal occult blood test (FOBT) of stool. / Every year beginning at age 28 and continuing until age 67. You may not have to do this test if you get a colonoscopy every 10 years.  Flexible sigmoidoscopy** or colonoscopy.** / Every 5 years for a flexible sigmoidoscopy or every 10 years for a colonoscopy beginning at age 30 and continuing until age 46.  Hepatitis C blood  test.** / For all people born from [birth year] through 1965 and any individual with known risks for hepatitis C.  Abdominal aortic aneurysm (AAA) screening.** / A one-time screening for ages 67 to 21 years who are current or former smokers.  Skin self-exam. / Monthly.  Influenza vaccine. / Every year.  Tetanus, diphtheria, and acellular pertussis (Tdap/Td) vaccine.** / 1 dose of Td every 10 years.  Varicella vaccine.** / Consult your health care provider.  Zoster vaccine.** / 1 dose for adults aged 54 years or older.  Pneumococcal 13-valent conjugate (PCV13) vaccine.** / Consult your health care provider.  Pneumococcal polysaccharide (PPSV23) vaccine.** / 1 dose for all adults aged 87 years and older.  Meningococcal vaccine.** / Consult your health care provider.  Hepatitis A vaccine.** / Consult your health care provider.  Hepatitis B vaccine.** / Consult your health care provider.  Haemophilus influenzae type b (Hib) vaccine.** / Consult your health care provider. **Family history and personal history of risk and conditions may change your health care provider's recommendations. Document Released: 08/09/2001 Document Revised: 06/18/2013 Document Reviewed: 11/08/2010 [facility] Patient Information 2015 [place], [place]. This information is not intended to replace advice given to you by your health care provider. Make sure you discuss any questions you have with your health care provider. Folliculitis  Folliculitis is redness, soreness, and swelling (inflammation) of the hair follicles. This condition can occur anywhere on the body. People with weakened immune systems, diabetes, or obesity have a greater risk of getting folliculitis. CAUSES  Bacterial infection. This is the most common cause.  Fungal infection.  Viral infection.  Contact with certain chemicals, especially oils and tars. Long-term folliculitis can result from bacteria that live in the nostrils. The bacteria may trigger  multiple outbreaks of folliculitis over time. SYMPTOMS Folliculitis most commonly occurs on the scalp, thighs, legs, back, buttocks, and areas where hair is shaved frequently. An early sign of folliculitis is a small, white or yellow, pus-filled, itchy lesion (pustule). These lesions appear on a red, inflamed follicle. They are usually less than 0.2 inches (5 mm) wide. When there is an infection of the follicle that goes deeper, it becomes a boil or furuncle. A group of closely packed boils creates a larger lesion (carbuncle). Carbuncles tend to occur in hairy, sweaty areas of the body. DIAGNOSIS  Your caregiver can usually tell what is wrong by doing a physical exam. A sample may be taken from one of the lesions and tested in a lab. This can help determine what is causing your folliculitis. TREATMENT  Treatment may include:  Applying warm compresses to the affected areas.  Taking antibiotic medicines orally or applying them to the skin.  Draining the lesions if they contain a large amount of pus or fluid.  Laser hair removal for cases of long-lasting folliculitis. This helps to prevent regrowth of the hair. HOME CARE INSTRUCTIONS  Apply warm compresses to the affected areas as directed by your caregiver.  If antibiotics are prescribed, take them as directed. Finish them even if you start to feel better.  You may take over-the-counter medicines to relieve itching.  Do not shave irritated skin.  Follow up with your caregiver as directed. SEEK IMMEDIATE MEDICAL CARE IF:   You have increasing redness, swelling, or pain in the affected area.  You have a fever. MAKE SURE YOU:  Understand these instructions.  Will watch your condition.  Will get help right away if you are not doing well or get worse. Document Released: 08/22/2001 Document Revised: 12/13/2011 Document Reviewed: 09/13/2011 [facility] Patient Information 2015 [place], [place]. This information is not intended to replace  advice given to you by your health care provider. Make sure you discuss any questions you have with your health care provider.

## 2014-02-10 NOTE — Progress Notes (Signed)
Pre visit review using our clinic review tool, if applicable. No additional management support is needed unless otherwise documented below in the visit note. 

## 2014-02-12 NOTE — Addendum Note (Signed)
Addended by: Roena MaladyEVONTENNO, MELANIE Y on: 02/12/2014 04:59 PM   Modules accepted: Orders

## 2016-08-01 ENCOUNTER — Telehealth: Payer: Self-pay

## 2016-08-01 MED ORDER — OSELTAMIVIR PHOSPHATE 75 MG PO CAPS: 75.0000 mg | ORAL_CAPSULE | Freq: Every day | ORAL | 0 refills | Status: DC

## 2016-08-01 NOTE — Telephone Encounter (Signed)
Pt left v/m; pts son diagnosed with the flu and pt request preventative generic tamiflu for flu to [facility]. Last seen 02/10/14. No future appt scheduled. Dr Patsy Lageropland out of office.Please advise.

## 2016-08-01 NOTE — Telephone Encounter (Signed)
Appropriate request.  eRx sent to pharmacy on file.  I hope his son feels better soon.

## 2017-08-16 ENCOUNTER — Telehealth: Payer: Self-pay | Admitting: Family Medicine

## 2017-08-16 NOTE — Telephone Encounter (Signed)
John Ray's father is not on his DPR.   Nor has patient been seen her in over 3 years at which time he saw Nicki ReaperRegina Baity for CPE on 02/10/2014.  Dr. Patsy Lageropland currently not taking new patients.  Please advise.  Should he be offered a new patient appointment with someone else?

## 2017-08-16 NOTE — Telephone Encounter (Signed)
Patient scheduled an appointment on 09/13/17.

## 2017-08-16 NOTE — Telephone Encounter (Signed)
With an adult (without DPR) this would be atypical.  Lyla SonCarrie, can you tell him that his father asked me to see if he would like to have a physical. He is an adult, and this is his decision, but I did feel an obligation to relay this message.   I am happy to see him.

## 2017-08-16 NOTE — Telephone Encounter (Signed)
Patient's father,Tony,came by and is asking for someone to call and remind patient of his cpx.  Patient's father said he hasn't had a physical in a couple of years and he's gained weight.  Patient's father said he had a heart attack at [age] and is concerned about his son.

## 2017-09-13 ENCOUNTER — Ambulatory Visit (INDEPENDENT_AMBULATORY_CARE_PROVIDER_SITE_OTHER): Payer: Managed Care, Other (non HMO) | Admitting: Family Medicine

## 2017-09-13 ENCOUNTER — Encounter: Payer: Self-pay | Admitting: Family Medicine

## 2017-09-13 ENCOUNTER — Other Ambulatory Visit: Payer: Self-pay

## 2017-09-13 VITALS — BP 120/84 | HR 72 | Temp 98.2°F | Ht 67.5 in | Wt 220.5 lb

## 2017-09-13 DIAGNOSIS — Z0000 Encounter for general adult medical examination without abnormal findings: Secondary | ICD-10-CM

## 2017-09-13 DIAGNOSIS — Z114 Encounter for screening for human immunodeficiency virus [HIV]: Secondary | ICD-10-CM

## 2017-09-13 LAB — CBC WITH DIFFERENTIAL/PLATELET
BASOS PCT: 0.9 %
Basophils Absolute: 61 cells/uL (ref 0–200)
EOS PCT: 2.2 %
Eosinophils Absolute: 150 cells/uL (ref 15–500)
HEMATOCRIT: 46.2 % (ref 38.5–50.0)
Hemoglobin: 15.9 g/dL (ref 13.2–17.1)
LYMPHS ABS: 2740 {cells}/uL (ref 850–3900)
MCH: 27.7 pg (ref 27.0–33.0)
MCHC: 34.4 g/dL (ref 32.0–36.0)
MCV: 80.3 fL (ref 80.0–100.0)
MPV: 12.5 fL (ref 7.5–12.5)
Monocytes Relative: 10 %
NEUTROS PCT: 46.6 %
Neutro Abs: 3169 cells/uL (ref 1500–7800)
PLATELETS: 231 10*3/uL (ref 140–400)
RBC: 5.75 10*6/uL (ref 4.20–5.80)
RDW: 12.9 % (ref 11.0–15.0)
TOTAL LYMPHOCYTE: 40.3 %
WBC mixed population: 680 cells/uL (ref 200–950)
WBC: 6.8 10*3/uL (ref 3.8–10.8)

## 2017-09-13 NOTE — Progress Notes (Signed)
Dr. Frederico Hamman T. Taryll Reichenberger, MD, [facility] [place], [address] Phone: [phone] Fax: [phone]  09/13/2017  Patient: John Ray, MRN: [id number], DOB: Oct 24, 1985, 32 y.o.  Primary Physician:  Owens Loffler, MD   Chief Complaint  Patient presents with  . Annual Exam   Subjective:   John Ray is a 31 y.o. pleasant patient who presents with the following:  Preventative Health Maintenance Visit:  Health Maintenance Summary Reviewed and updated, unless pt declines services.  Tobacco History Reviewed. Alcohol: No concerns, no excessive use Exercise Habits: Some activity, rec at least 30 mins 5 times a week STD concerns: no risk or activity to increase risk Drug Use: None Encouraged self-testicular check  ? Forgetful some.  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 Stress with finances.  63 and 79 yo children.  Autistic son, butts heads with son.    Health Maintenance  Topic Date Due  . INFLUENZA VACCINE  09/24/2017 (Originally 01/25/2017)  . HIV Screening  09/14/2018 (Originally 02/15/1999)  . TETANUS/TDAP  08/24/2025   Immunization History  Administered Date(s) Administered  . Td 08/25/2015   There are no active problems to display for this patient.  Past Medical History:  Diagnosis Date  . 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 Pericarditis    History reviewed. No pertinent surgical history. Social History   Socioeconomic History  . Marital status: Married    Spouse name: Not on file  . Number of children: Not on file  . Years of education: Not on file  . Highest education level: Not on file  Social Needs  . Financial resource strain: Not on file  . Food insecurity - worry: Not on file  . Food insecurity - inability: Not on file  . Transportation needs - medical: Not on file  . Transportation needs - non-medical: Not on  file  Occupational History  . Not on file  Tobacco Use  . Smoking status: Never Smoker  . Smokeless tobacco: Former Network engineer and Sexual Activity  . Alcohol use: Yes    Comment: occasional--social  . Drug use: No  . Sexual activity: Not on file  Other Topics Concern  . Not on file  Social History Narrative  . Not on file   Family History  Problem Relation Age of Onset  . Diabetes Mother   . Hyperlipidemia Mother   . Heart attack Mother   . Heart disease Father   . Diabetes Father   . Heart attack Father   . ALS Maternal Grandmother   . Dementia Paternal Grandmother   . Colon cancer Paternal Grandmother   . Heart attack Paternal Grandfather    No Known Allergies  Medication list has been reviewed and updated.   General: Denies fever, chills, sweats. No significant weight loss. Eyes: Denies blurring,significant itching ENT: Denies earache, sore throat, and hoarseness. Cardiovascular: Denies chest pains, palpitations, dyspnea on exertion Respiratory: Denies cough, dyspnea at rest,wheeezing Breast: no concerns about lumps GI: Denies nausea, vomiting, diarrhea, constipation, change in bowel habits, abdominal pain, melena, hematochezia GU: Denies penile discharge, ED, urinary flow / outflow problems. No STD concerns. Musculoskeletal: Denies back pain, joint pain Derm: Denies rash, itching Neuro: Denies  paresthesias, frequent falls, frequent headaches Psych: Denies depression, anxiety Endocrine: Denies cold intolerance, heat intolerance, polydipsia Heme: Denies enlarged lymph nodes Allergy: No hayfever  Objective:   BP 120/84  Pulse 72   Temp 98.2 F (36.8 C) (Oral)   Ht 5' 7.5" (1.715 m)   Wt 220 lb 8 oz (100 kg)   BMI 34.03 kg/m  Ideal Body Weight: Weight in (lb) to have BMI = 25: 161.7  No exam data present  GEN: well developed, well nourished, no acute distress Eyes: conjunctiva and lids normal, PERRLA, EOMI ENT: TM clear, nares clear, oral exam  WNL Neck: supple, no lymphadenopathy, no thyromegaly, no JVD Pulm: clear to auscultation and percussion, respiratory effort normal CV: regular rate and rhythm, S1-S2, no murmur, rub or gallop, no bruits, peripheral pulses normal and symmetric, no cyanosis, clubbing, edema or varicosities GI: soft, non-tender; no hepatosplenomegaly, masses; active bowel sounds all quadrants GU: no hernia, testicular mass, penile discharge Lymph: no cervical, axillary or inguinal adenopathy MSK: gait normal, muscle tone and strength WNL, no joint swelling, effusions, discoloration, crepitus  SKIN: clear, good turgor, color WNL, no rashes, lesions, or ulcerations Neuro: normal mental status, normal strength, sensation, and motion Psych: alert; oriented to person, place and time, normally interactive and not anxious or depressed in appearance. All labs reviewed with patient.  Lipids:    Component Value Date/Time   CHOL 188 02/10/2014 0901   TRIG 696.0 (H) 02/10/2014 0901   HDL 26.70 (L) 02/10/2014 0901   LDLDIRECT 83.6 02/10/2014 0901   VLDL 139.2 (H) 02/10/2014 0901   CHOLHDL 7 02/10/2014 0901   CBC: CBC Latest Ref Rng & Units 02/10/2014 02/28/2013 01/30/2012  WBC 4.0 - 10.5 K/uL 6.5 8.2 7.5  Hemoglobin 13.0 - 17.0 g/dL 14.6 14.9 14.5  Hematocrit 39.0 - 52.0 % 43.5 44.8 43.7  Platelets 150.0 - 400.0 K/uL 205.0 237.0 440.3    Basic Metabolic Panel:    Component Value Date/Time   NA 139 02/10/2014 0901   K 4.2 02/10/2014 0901   CL 104 02/10/2014 0901   CO2 28 02/10/2014 0901   BUN 14 02/10/2014 0901   CREATININE 0.9 02/10/2014 0901   CREATININE 0.82 05/18/2011 1456   GLUCOSE 86 02/10/2014 0901   CALCIUM 9.0 02/10/2014 0901   Hepatic Function Latest Ref Rng & Units 02/10/2014 02/28/2013 01/30/2012  Total Protein 6.0 - 8.3 g/dL 7.1 7.8 7.6  Albumin 3.5 - 5.2 g/dL 4.2 4.5 4.5  AST 0 - 37 U/L 32 34 51(H)  ALT 0 - 53 U/L 60(H) 61(H) 95(H)  Alk Phosphatase 39 - 117 U/L 59 58 58  Total Bilirubin 0.2 - 1.2  mg/dL 0.5 0.9 0.8  Bilirubin, Direct 0.0 - 0.3 mg/dL - 0.0 0.1    No results found for: TSH No results found for: PSA  Assessment and Plan:   Healthcare maintenance - Plan: Basic metabolic panel, CBC with Differential/Platelet, Hepatic function panel, Lipid panel  Screening for HIV (human immunodeficiency virus) - Plan: HIV antibody  Health Maintenance Exam: The patient's preventative maintenance and recommended screening tests for an annual wellness exam were reviewed in full today. Brought up to date unless services declined.  Counselled on the importance of diet, exercise, and its role in overall health and mortality. The patient's FH and SH was reviewed, including their home life, tobacco status, and drug and alcohol status.  Follow-up in 1 year for physical exam or additional follow-up below.  Follow-up: No Follow-up on file. Or follow-up in 1 year if not noted.  Medications Discontinued During This Encounter  Medication Reason  . oseltamivir (TAMIFLU) 75 MG capsule Completed Course  . mupirocin ointment (BACTROBAN) 2 % Completed Course  Orders Placed This Encounter  Procedures  . Basic metabolic panel  . CBC with Differential/Platelet  . Hepatic function panel  . Lipid panel  . HIV antibody    Signed,  Frederico Hamman T. Star Cheese, MD   Allergies as of 09/13/2017   No Known Allergies     Medication List    as of 09/13/2017 10:30 AM   You have not been prescribed any medications.

## 2017-09-14 LAB — HEPATIC FUNCTION PANEL
AG Ratio: 1.6 (calc) (ref 1.0–2.5)
ALBUMIN MSPROF: 4.7 g/dL (ref 3.6–5.1)
ALT: 67 U/L — AB (ref 9–46)
AST: 35 U/L (ref 10–40)
Alkaline phosphatase (APISO): 66 U/L (ref 40–115)
Bilirubin, Direct: 0.2 mg/dL (ref 0.0–0.2)
Globulin: 2.9 g/dL (calc) (ref 1.9–3.7)
Indirect Bilirubin: 0.7 mg/dL (calc) (ref 0.2–1.2)
Total Bilirubin: 0.9 mg/dL (ref 0.2–1.2)
Total Protein: 7.6 g/dL (ref 6.1–8.1)

## 2017-09-14 LAB — HIV ANTIBODY (ROUTINE TESTING W REFLEX): HIV: NONREACTIVE

## 2017-09-14 LAB — BASIC METABOLIC PANEL
BUN: 9 mg/dL (ref 7–25)
CALCIUM: 9.5 mg/dL (ref 8.6–10.3)
CO2: 26 mmol/L (ref 20–32)
Chloride: 104 mmol/L (ref 98–110)
Creat: 0.88 mg/dL (ref 0.60–1.35)
Glucose, Bld: 89 mg/dL (ref 65–99)
POTASSIUM: 4.1 mmol/L (ref 3.5–5.3)
Sodium: 139 mmol/L (ref 135–146)

## 2017-09-14 LAB — LIPID PANEL
CHOLESTEROL: 200 mg/dL — AB (ref <200)
HDL: 42 mg/dL (ref >40)
LDL CHOLESTEROL (CALC): 131 mg/dL — AB
Non-HDL Cholesterol (Calc): 158 mg/dL (calc) — ABNORMAL HIGH (ref <130)
TRIGLYCERIDES: 154 mg/dL — AB (ref <150)
Total CHOL/HDL Ratio: 4.8 (calc) (ref <5.0)

## 2017-09-19 ENCOUNTER — Encounter: Payer: Self-pay | Admitting: *Deleted

## 2017-10-04 ENCOUNTER — Other Ambulatory Visit: Payer: Self-pay

## 2017-10-04 ENCOUNTER — Emergency Department (HOSPITAL_COMMUNITY)
Admission: EM | Admit: 2017-10-04 | Discharge: 2017-10-04 | Disposition: A | Discharge status: Home / Self Care | Payer: Managed Care, Other (non HMO) | Attending: Emergency Medicine | Admitting: Emergency Medicine

## 2017-10-04 ENCOUNTER — Emergency Department (HOSPITAL_COMMUNITY): Payer: Managed Care, Other (non HMO)

## 2017-10-04 ENCOUNTER — Encounter (HOSPITAL_COMMUNITY): Payer: Self-pay | Admitting: *Deleted

## 2017-10-04 DIAGNOSIS — R0602 Shortness of breath: Secondary | ICD-10-CM | POA: Insufficient documentation

## 2017-10-04 DIAGNOSIS — I300 Acute nonspecific idiopathic pericarditis: Secondary | ICD-10-CM | POA: Diagnosis not present

## 2017-10-04 DIAGNOSIS — R079 Chest pain, unspecified: Secondary | ICD-10-CM | POA: Diagnosis present

## 2017-10-04 DIAGNOSIS — Z8679 Personal history of other diseases of the circulatory system: Secondary | ICD-10-CM | POA: Diagnosis not present

## 2017-10-04 LAB — BASIC METABOLIC PANEL
ANION GAP: 12 (ref 5–15)
BUN: 10 mg/dL (ref 6–20)
CO2: 23 mmol/L (ref 22–32)
CREATININE: 0.89 mg/dL (ref 0.61–1.24)
Calcium: 8.9 mg/dL (ref 8.9–10.3)
Chloride: 103 mmol/L (ref 101–111)
Glucose, Bld: 115 mg/dL — ABNORMAL HIGH (ref 65–99)
Potassium: 3.8 mmol/L (ref 3.5–5.1)
Sodium: 138 mmol/L (ref 135–145)

## 2017-10-04 LAB — CBC
HCT: 48.8 % (ref 39.0–52.0)
HEMOGLOBIN: 16.2 g/dL (ref 13.0–17.0)
MCH: 27.5 pg (ref 26.0–34.0)
MCHC: 33.2 g/dL (ref 30.0–36.0)
MCV: 82.9 fL (ref 78.0–100.0)
PLATELETS: 239 10*3/uL (ref 150–400)
RBC: 5.89 MIL/uL — AB (ref 4.22–5.81)
RDW: 12.4 % (ref 11.5–15.5)
WBC: 10.7 10*3/uL — ABNORMAL HIGH (ref 4.0–10.5)

## 2017-10-04 LAB — I-STAT TROPONIN, ED
TROPONIN I, POC: 0 ng/mL (ref 0.00–0.08)
Troponin i, poc: 0 ng/mL (ref 0.00–0.08)

## 2017-10-04 LAB — BRAIN NATRIURETIC PEPTIDE: B Natriuretic Peptide: 11.2 pg/mL (ref 0.0–100.0)

## 2017-10-04 LAB — D-DIMER, QUANTITATIVE: D-Dimer, Quant: 0.3 ug/mL-FEU (ref 0.00–0.50)

## 2017-10-04 MED ORDER — METOCLOPRAMIDE HCL 5 MG/ML IJ SOLN
10.0000 mg | Freq: Once | INTRAMUSCULAR | Status: AC
  Administered 2017-10-04: 10 mg via INTRAVENOUS
  Filled 2017-10-04: qty 2

## 2017-10-04 MED ORDER — DIPHENHYDRAMINE HCL 50 MG/ML IJ SOLN
25.0000 mg | Freq: Once | INTRAMUSCULAR | Status: AC
  Administered 2017-10-04: 25 mg via INTRAVENOUS
  Filled 2017-10-04: qty 1

## 2017-10-04 MED ORDER — SODIUM CHLORIDE 0.9 % IV BOLUS
1000.0000 mL | Freq: Once | INTRAVENOUS | Status: AC
  Administered 2017-10-04: 1000 mL via INTRAVENOUS

## 2017-10-04 MED ORDER — IBUPROFEN 600 MG PO TABS: 600.0000 mg | ORAL_TABLET | Freq: Four times a day (QID) | ORAL | 0 refills | Status: DC | PRN

## 2017-10-04 MED ORDER — KETOROLAC TROMETHAMINE 15 MG/ML IJ SOLN
15.0000 mg | Freq: Once | INTRAMUSCULAR | Status: AC
  Administered 2017-10-04: 15 mg via INTRAVENOUS
  Filled 2017-10-04: qty 1

## 2017-10-04 MED ORDER — FENTANYL CITRATE (PF) 100 MCG/2ML IJ SOLN: 50.0000 ug | Freq: Once | INTRAMUSCULAR | Status: DC

## 2017-10-04 NOTE — ED Notes (Signed)
Cards paged to [phone]-per Dr. Hinton RaoNanavati-paged by Marylene LandAngela

## 2017-10-04 NOTE — Discharge Instructions (Addendum)
We suspect that your chest pain is from pericarditis. Please start taking the medications prescribed.  Please return to the ER if you have worsening chest pain, shortness of breath, pain radiating to your jaw, shoulder or back, sweats or fainting. Otherwise see the Cardiologist or your primary care doctor as requested.

## 2017-10-04 NOTE — ED Notes (Signed)
Dr. Nanavati at bedside 

## 2017-10-04 NOTE — ED Provider Notes (Addendum)
John Ray [facility] EMERGENCY DEPARTMENT Provider Note   CSN: [id number] Arrival date & time: 10/04/17  3244     History   Chief Complaint Chief Complaint  Patient presents with  . Chest Pain    HPI John Ray is a 32 y.o. male.  HPI 32 year old comes in with chief complaint of chest pain.  Patient has history of pericarditis.  According to patient he has been feeling unwell for the last 3 days having nonspecific midsternal chest pain, and shortness of breath with exertion.  Patient's pain is anterior and nonradiating.  Patient does not have a history of similar pain.  Pain is worse with palpation, exertion and it also appears to be positional.  Patient's chest pain is worse when he sitting up.  Associated symptoms include shortness of breath.  Patient states that walking 2 or 3 steps gets him short of breath now which is unusual.  This morning patient was feeling worse therefore decided to come to the ER.  At triage patient was noted to be diaphoretic and nauseated he was also having dizziness.  Patient does not have any significant medical history.  He however has CAD that runs in his family, and his father had his first heart attack at age 98.  Patient does not smoke or use any illicit substance.  Pt has no hx of PE, DVT and denies any exogenous hormone (testosterone / estrogen) use, long distance travels or surgery in the past 6 weeks, active cancer, recent immobilization.  Patient also denies any recent infections.  Past Medical History:  Diagnosis Date  . 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 Pericarditis     Patient Active Problem List   Diagnosis Date Noted  . Chest pain 10/09/2017  . Disease of pericardium 10/09/2017  . Family history of coronary artery disease 10/09/2017  . Mixed hyperlipidemia 10/09/2017    History reviewed. No pertinent surgical history.       Home Medications    Prior to Admission  medications   Medication Sig Start Date End Date Taking? Authorizing Provider  ibuprofen (ADVIL,MOTRIN) 600 MG tablet Take 1 tablet (600 mg total) by mouth every 6 (six) hours as needed. 10/04/17   Derwood Kaplan, MD    Family History Family History  Problem Relation Age of Onset  . Diabetes Mother   . Hyperlipidemia Mother   . Heart attack Mother   . Heart disease Father   . Diabetes Father   . Heart attack Father   . ALS Maternal Grandmother   . Dementia Paternal Grandmother   . Colon cancer Paternal Grandmother   . Heart attack Paternal Grandfather     Social History Social History   Tobacco Use  . Smoking status: Never Smoker  . Smokeless tobacco: Former Engineer, water Use Topics  . Alcohol use: Yes    Comment: occasional--social  . Drug use: No     Allergies   Patient has no known allergies.   Review of Systems Review of Systems   Physical Exam Updated Vital Signs BP 119/83 (BP Location: Right Arm)   Pulse 79   Temp 98.6 F (37 C) (Oral)   Resp 18   Ht 5' 7.5" (1.715 m)   Wt 99.8 kg (220 lb)   SpO2 99%   BMI 33.95 kg/m   Physical Exam   ED Treatments / Results  Labs (all labs ordered are listed, but only abnormal results are displayed) Labs Reviewed  BASIC METABOLIC PANEL - Abnormal; Notable for the following components:      Result Value   Glucose, Bld 115 (*)    All other components within normal limits  CBC - Abnormal; Notable for the following components:   WBC 10.7 (*)    RBC 5.89 (*)    All other components within normal limits  BRAIN NATRIURETIC PEPTIDE  D-DIMER, QUANTITATIVE (NOT AT [facility])  I-STAT TROPONIN, ED  I-STAT TROPONIN, ED    EKG EKG Interpretation  Date/Time:  Wednesday October 04 2017 08:30:23 EDT Ventricular Rate:  45 PR Interval:  170 QRS Duration: 104 QT Interval:  400 QTC Calculation: 346 R Axis:   64 Text Interpretation:  Sinus bradycardia with marked sinus arrhythmia Possible Left atrial enlargement  Incomplete right bundle branch block Cannot rule out Anteroseptal infarct , age undetermined Abnormal ECG q waves in septal leads with non specific ST changes Confirmed by Derwood Kaplananavati, Alka Falwell [phone]([id number]) on 10/04/2017 8:56:01 AM   Repeat:  EKG Interpretation  Date/Time:  Wednesday October 04 2017 08:53:54 EDT Ventricular Rate:  117 PR Interval:  156 QRS Duration: 96 QT Interval:  338 QTC Calculation: 471 R Axis:   25 Text Interpretation:  Sinus tachycardia Possible Left atrial enlargement Possible Anteroseptal infarct , age undetermined Abnormal ECG Nonspecific ST and T wave abnormality q waves in v1 and v2 are persistent with ST elevation changes. ST elevation is > 2 mm in V2. No ST depression Confirmed by Derwood KaplanNanavati, Nicky Kras [phone]([id number]) on 10/04/2017 9:06:30 AM        Radiology No results found.  Procedures Procedures (including critical care time)    EMERGENCY DEPARTMENT US CARDIAC EXAM "Study: Limited Ultrasound of the heart and pericardium"  INDICATIONS:Abnormal vital signs and Chest pain Multiple views of the heart and pericardium were obtained in real-time with a multi-frequency probe.  PERFORMED HQ:IONGEXBY:Myself  IMAGES ARCHIVED?: Yes  FINDINGS: Small effusion, Normal contractility and Tamponade physiology absent  LIMITATIONS:  Body habitus and Emergent procedure  VIEWS USED: Parasternal long axis and Parasternal short axis  INTERPRETATION: Cardiac activity present, Pericardial effusion present, Cardiac tamponade absent and Normal contractility  CPT Code: 52841-3293308-26 (limited transthoracic cardiac)   Medications Ordered in ED Medications  ketorolac (TORADOL) 15 MG/ML injection 15 mg (15 mg Intravenous Given 10/04/17 1110)  ketorolac (TORADOL) 15 MG/ML injection 15 mg (15 mg Intravenous Given 10/04/17 1628)  metoCLOPramide (REGLAN) injection 10 mg (10 mg Intravenous Given 10/04/17 1629)  diphenhydrAMINE (BENADRYL) injection 25 mg (25 mg Intravenous Given 10/04/17 1631)  sodium chloride 0.9 %  bolus 1,000 mL (0 mLs Intravenous Stopped 10/04/17 1746)     Initial Impression / Assessment and Plan / ED Course  I have reviewed the triage vital signs and the nursing notes.  Pertinent labs & imaging results that were available during my care of the patient were reviewed by me and considered in my medical decision making (see chart for details).     32 year old male comes into the ER with chest pain. He is noted to be short of breath, nauseated and diaphoretic in the triage.  Initial EKG shows ST elevations in V1 and V2 with Q waves.  Differential diagnosis includes: ACS syndrome Aortic dissection Myocarditis Pericarditis Endocarditis Pericardial effusion / tamponade Pleural effusion / Pulmonary edema PE Pneumothorax Musculoskeletal pain PUD / Gastritis / Esophagitis Esophageal spasm  Patient has a concerning family history of ACS, with his father having heart attack at age 34.  Patient's social history is not concerning.  Although his presentation at  arrival was dramatic and patient had bradycardia with Q waves and ST changes, I think he was actually having a vasovagal episode.  I still spoke with Dr. Katrinka Blazing, cardiology -who agrees that patient needs further workup and not a STEMI alert at this time.  Based on my evaluation highest a month start differential diagnoses include pericarditis, myocarditis, PE and ACS.  Final Clinical Impressions(s) / ED Diagnoses   Final diagnoses:  Acute idiopathic pericarditis    ED Discharge Orders        Ordered    ibuprofen (ADVIL,MOTRIN) 600 MG tablet  Every 6 hours PRN     10/04/17 1413       Derwood Kaplan, MD 10/04/17 8295    Derwood Kaplan, MD 11/08/17 2345

## 2017-10-04 NOTE — ED Notes (Signed)
Ambulated pt in hall.  Pt had steady gait but stated he felt short of breath and light headed.  O2 stayed around 95-96%.

## 2017-10-04 NOTE — ED Triage Notes (Signed)
Pt states sob, and chest pain with inspiration since Monday.  Today pain increased.  Pt able to ambulate to ED, but in triage became diaphoretic, began vomiting, and hr dropped to 42.

## 2017-10-07 NOTE — Progress Notes (Signed)
Cardiology Office Note  Date:  10/09/2017   ID:  John Ray, DOB 1986/01/28, MRN [id number]  PCP:  Hannah Beatopland, [place], MD   Chief Complaint  Patient presents with  . Other    [facility]. Patient SOB with cough and leg pain right behind the knee in both legs. Meds reviewed verbally with patient.    HPI:  32 year old  history of pericarditis, sophomore in college, in the ICU 3 days Who presents after recent episode of chest discomfort, emergency room visit  Reports that symptoms started April 7 in the evening Started developing chest discomfort Felt unwell in general for the next 3 days  Symptoms got worse and Presented to the emergency room October 04, 2017 with chest pain Symptoms described as nonspecific midsternal chest pain, and shortness of breath with exertion.  anterior and nonradiating.    In the ER , very agitated Heart rate low 40 to 50s on arrival Vomiting in the ER Then sinus tach Received Toradol, Benadryl IV fluids  Notes described the pain as worse with palpation, exertion and it also appears to be positional.    worse when he sitting up.   He had some shortness of breath.   Patient states that walking 2 or 3 steps gets him short of breath now which is unusual.   diaphoretic and nauseated dizziness.  Since then he has had diarrhea daily for approximately the past 4 days No more vomiting Apart from NSAIDs in the emergency room he has not been taking any ibuprofen since discharge  Blood work in the ER was essentially normal reviewed personally by myself D-dimer negative BNP negative Cardiac enzymes negative  In terms of his family history father had his first heart attack at age 34.    Patient does not smoke or use any illicit substance.  EKG personally reviewed by myself on todays visit Show Normal sinus rhythm with rate 64 bpm no significant ST or T wave changes   PMH:   has a past medical history of Concussion  and Pericarditis.  PSH:   History reviewed. No pertinent surgical history.  Current Outpatient Medications  Medication Sig Dispense Refill  . ibuprofen (ADVIL,MOTRIN) 600 MG tablet Take 1 tablet (600 mg total) by mouth every 6 (six) hours as needed. 30 tablet 0   No current facility-administered medications for this visit.      Allergies:   Patient has no known allergies.   Social History:  The patient  reports that he has never smoked. He has quit using smokeless tobacco. He reports that he drinks alcohol. He reports that he does not use drugs.   Family History:   family history includes ALS in his maternal grandmother; Colon cancer in his paternal grandmother; Dementia in his paternal grandmother; Diabetes in his father and mother; Heart attack in his father, mother, and paternal grandfather; Heart disease in his father; Hyperlipidemia in his mother.    Review of Systems: Review of Systems  Constitutional: Negative.   Respiratory: Negative.   Cardiovascular: Positive for chest pain.  Gastrointestinal: Positive for diarrhea.  Musculoskeletal: Negative.   Neurological: Negative.   Psychiatric/Behavioral: Negative.   All other systems reviewed and are negative.    PHYSICAL EXAM: VS:  BP 126/76 (BP Location: Left Arm, Patient Position: Sitting, Cuff Size: Normal)   Pulse 64   Ht 5\' 8"  (1.727 m)   Wt 215 lb 8 oz (97.8 kg)   BMI 32.77 kg/m  , BMI Body mass  index is 32.77 kg/m. GEN: Well nourished, well developed, in no acute distress  HEENT: normal  Neck: no JVD, carotid bruits, or masses Cardiac: RRR; no murmurs, rubs, or gallops,no edema  Respiratory:  clear to auscultation bilaterally, normal work of breathing GI: soft, nontender, nondistended, + BS MS: no deformity or atrophy  Skin: warm and dry, no rash Neuro:  Strength and sensation are intact Psych: euthymic mood, full affect   Recent Labs: 09/13/2017: ALT 67 10/04/2017: B Natriuretic Peptide 11.2; BUN 10;  Creatinine, Ser 0.89; Hemoglobin 16.2; Platelets 239; Potassium 3.8; Sodium 138    Lipid Panel Lab Results  Component Value Date   CHOL 200 (H) 09/13/2017   HDL 42 09/13/2017   LDLCALC 131 (H) 09/13/2017   TRIG 154 (H) 09/13/2017      Wt Readings from Last 3 Encounters:  10/09/17 215 lb 8 oz (97.8 kg)  10/04/17 220 lb (99.8 kg)  09/13/17 220 lb 8 oz (100 kg)       ASSESSMENT AND PLAN:  Chest pain, unspecified type - Concerning for pericarditis possibly in the setting of viral gastroenteritis Had vomiting, still with diarrhea since hospital discharge on the 13th Currently pain-free Discussed various treatment options for workup We have ordered echocardiogram to rule out pericardial effusion and pericardial thickening or myocarditis  Pericarditis, unspecified chronicity, unspecified type - Previous history when he was sophomore in college Suspect recurrent symptoms possibly in the setting of a viral infection though not clear Unable to exclude idiopathic mechanism, though he does continue to have diarrhea and did have vomiting in the emergency room Discussed use of NSAIDs for recurrent symptoms Suggested he call our office if he has recurrent symptoms He does have prescription for ibuprofen 800 3 times daily that can be used  Family history of coronary artery disease Long discussion with him, father with extensive coronary disease history Recommended CT coronary calcium scoring  Mixed hyperlipidemia  continue exercise, careful diet management in an effort to lose weight.  Disposition:   F/U as needed   Total encounter time more than 60 minutes  Greater than 50% was spent in counseling and coordination of care with the patient    Orders Placed This Encounter  Procedures  . CT CARDIAC SCORING  . EKG 12-Lead  . ECHOCARDIOGRAM COMPLETE     Signed, Dossie Arbour, M.D., Ph.D. 10/09/2017  [facility] [place], [place] [phone]

## 2017-10-09 ENCOUNTER — Encounter: Payer: Self-pay | Admitting: Cardiovascular Disease

## 2017-10-09 ENCOUNTER — Ambulatory Visit: Payer: Managed Care, Other (non HMO) | Admitting: Cardiovascular Disease

## 2017-10-09 ENCOUNTER — Ambulatory Visit: Payer: Managed Care, Other (non HMO) | Admitting: Cardiology

## 2017-10-09 VITALS — BP 126/76 | HR 64 | Ht 68.0 in | Wt 215.5 lb

## 2017-10-09 DIAGNOSIS — E782 Mixed hyperlipidemia: Secondary | ICD-10-CM

## 2017-10-09 DIAGNOSIS — I319 Disease of pericardium, unspecified: Secondary | ICD-10-CM | POA: Diagnosis not present

## 2017-10-09 DIAGNOSIS — Z8249 Family history of ischemic heart disease and other diseases of the circulatory system: Secondary | ICD-10-CM | POA: Diagnosis not present

## 2017-10-09 DIAGNOSIS — R079 Chest pain, unspecified: Secondary | ICD-10-CM | POA: Insufficient documentation

## 2017-10-09 NOTE — Patient Instructions (Addendum)
Call the office for additional episodes of chest pain concerning for pericarditis  Medication Instructions:   No medication changes made  Ibuprofen /nsaids for pericarditis if it comes back again  Labwork:  No new labs needed  Testing/Procedures:  CT coronary calcium score for chest pain - $150/ out of pocket - call [phone] to schedule - [address] [place], [id number]  We will schedule an echo for pericarditis, chest pain Echocardiography is a painless test that uses sound waves to create images of your heart. It provides your doctor with information about the size and shape of your heart and how well your heart's chambers and valves are working. This procedure takes approximately one hour. There are no restrictions for this procedure.   Follow-Up: It was a pleasure seeing you in the office today. Please call us if you have new issues that need to be addressed before your next appt.  [phone]  Your physician wants you to follow-up in:  As needed  If you need a refill on your cardiac medications before your next appointment, please call your pharmacy.  For educational health videos Log in to : [email or web address] Or : [email or web address], password : [email or web address]

## 2017-10-18 ENCOUNTER — Ambulatory Visit (INDEPENDENT_AMBULATORY_CARE_PROVIDER_SITE_OTHER)
Admission: RE | Admit: 2017-10-18 | Discharge: 2017-10-18 | Disposition: A | Discharge status: Home / Self Care | Payer: Self-pay | Source: Ambulatory Visit | Attending: Cardiovascular Disease | Admitting: Cardiovascular Disease

## 2017-10-18 DIAGNOSIS — R079 Chest pain, unspecified: Secondary | ICD-10-CM

## 2017-10-18 DIAGNOSIS — I319 Disease of pericardium, unspecified: Secondary | ICD-10-CM

## 2017-10-20 ENCOUNTER — Other Ambulatory Visit: Payer: Self-pay

## 2017-10-20 ENCOUNTER — Ambulatory Visit (INDEPENDENT_AMBULATORY_CARE_PROVIDER_SITE_OTHER): Payer: Managed Care, Other (non HMO)

## 2017-10-20 DIAGNOSIS — I319 Disease of pericardium, unspecified: Secondary | ICD-10-CM

## 2017-10-20 DIAGNOSIS — R079 Chest pain, unspecified: Secondary | ICD-10-CM

## 2017-10-20 MED ORDER — PERFLUTREN LIPID MICROSPHERE
1.0000 mL | INTRAVENOUS | Status: AC | PRN
  Administered 2017-10-20: 2 mL via INTRAVENOUS

## 2017-10-25 ENCOUNTER — Encounter: Payer: Self-pay | Admitting: Cardiovascular Disease

## 2017-11-03 NOTE — ED Provider Notes (Deleted)
[facility] EMERGENCY DEPARTMENT Provider Note   CSN: [id number] Arrival date & time: 10/04/17  4098     History   Chief Complaint Chief Complaint  Patient presents with  . Chest Pain    HPI John Ray is a 32 y.o. male.  HPI  32 year old history of pericarditis and family history of early CAD in his father comes in with chief complaint of chest pain. Patient reports that he started having chest discomfort with inspiration about 2 days ago.  Patient pain has gotten more constant and intense since then. Chest pain is generalized, midsternal and not radiating. Pain is not necessarily positional.  In the triage patient was found to be diaphoretic and bradycardic -with heart rate in the 40s.  Patient denies any substance abuse and he denies any history of CAD for himself.   Past Medical History:  Diagnosis Date  . 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 Pericarditis     Patient Active Problem List   Diagnosis Date Noted  . Chest pain 10/09/2017  . Disease of pericardium 10/09/2017  . Family history of coronary artery disease 10/09/2017  . Mixed hyperlipidemia 10/09/2017    History reviewed. No pertinent surgical history.      Home Medications    Prior to Admission medications   Medication Sig Start Date End Date Taking? Authorizing Provider  ibuprofen (ADVIL,MOTRIN) 600 MG tablet Take 1 tablet (600 mg total) by mouth every 6 (six) hours as needed. 10/04/17   Derwood Kaplan, MD    Family History Family History  Problem Relation Age of Onset  . Diabetes Mother   . Hyperlipidemia Mother   . Heart attack Mother   . Heart disease Father   . Diabetes Father   . Heart attack Father   . ALS Maternal Grandmother   . Dementia Paternal Grandmother   . Colon cancer Paternal Grandmother   . Heart attack Paternal Grandfather     Social History Social History   Tobacco Use  . Smoking status: Never Smoker  .  Smokeless tobacco: Former Engineer, water Use Topics  . Alcohol use: Yes    Comment: occasional--social  . Drug use: No     Allergies   Patient has no known allergies.   Review of Systems Review of Systems  Constitutional: Positive for activity change.  Respiratory: Positive for shortness of breath.   Cardiovascular: Positive for chest pain.  Gastrointestinal: Positive for nausea and vomiting.  Allergic/Immunologic: Negative for immunocompromised state.  All other systems reviewed and are negative.    Physical Exam Updated Vital Signs BP 119/83 (BP Location: Right Arm)   Pulse 79   Temp 98.6 F (37 C) (Oral)   Resp 18   Ht 5' 7.5" (1.715 m)   Wt 99.8 kg (220 lb)   SpO2 99%   BMI 33.95 kg/m   Physical Exam  Constitutional: He is oriented to person, place, and time. He appears well-developed.  HENT:  Head: Atraumatic.  Neck: Neck supple.  Cardiovascular: Normal rate, intact distal pulses and normal pulses.  No extrasystoles are present. Exam reveals no distant heart sounds and no friction rub.  Pulmonary/Chest: Effort normal.  Neurological: He is alert and oriented to person, place, and time.  Skin: Skin is warm.  Nursing note and vitals reviewed.    ED Treatments / Results  Labs (all labs ordered are listed, but only abnormal results are displayed) Labs Reviewed  [facility] -  Abnormal; Notable for the following components:      Result Value   Glucose, Bld 115 (*)    All other components within normal limits  CBC - Abnormal; Notable for the following components:   WBC 10.7 (*)    RBC 5.89 (*)    All other components within normal limits  BRAIN NATRIURETIC PEPTIDE  D-DIMER, QUANTITATIVE (NOT AT [facility])  I-STAT TROPONIN, ED  I-STAT TROPONIN, ED    EKG EKG Interpretation  Date/Time:  Wednesday October 04 2017 08:53:54 EDT Ventricular Rate:  117 PR Interval:  156 QRS Duration: 96 QT Interval:  338 QTC Calculation: 471 R Axis:   25 Text  Interpretation:  Sinus tachycardia Possible Left atrial enlargement Possible Anteroseptal infarct , age undetermined Abnormal ECG Nonspecific ST and T wave abnormality q waves in v1 and v2 are persistent with ST elevation changes. ST elevation is > 2 mm in V2. No ST depression Confirmed by Derwood Kaplan [phone]) on 10/04/2017 9:06:30 AM   Radiology No results found.  Procedures Procedures (including critical care time)  Medications Ordered in ED Medications  ketorolac (TORADOL) 15 MG/ML injection 15 mg (15 mg Intravenous Given 10/04/17 1110)  ketorolac (TORADOL) 15 MG/ML injection 15 mg (15 mg Intravenous Given 10/04/17 1628)  metoCLOPramide (REGLAN) injection 10 mg (10 mg Intravenous Given 10/04/17 1629)  diphenhydrAMINE (BENADRYL) injection 25 mg (25 mg Intravenous Given 10/04/17 1631)  sodium chloride 0.9 % bolus 1,000 mL (0 mLs Intravenous Stopped 10/04/17 1746)     Initial Impression / Assessment and Plan / ED Course  I have reviewed the triage vital signs and the nursing notes.  Pertinent labs & imaging results that were available during my care of the patient were reviewed by me and considered in my medical decision making (see chart for details).     32 year old comes in with chief complaint of chest pain. Patient has family history of CAD and he also has history of pericarditis.  Patient's pain is pleuritic and he has associated shortness of breath.  Patient's pain is not positional and there is no evidence of pericarditis on exam.  Differential diagnosis however does include pericarditis along with PE and sick sinus syndrome. Patient was given Toradol and his pain responded.  His heart rate also improved over time.  Clinically, suspicion for pericarditis with a higher than PEand ACS and we will advised patient to follow-up with cardiology for further work-up. Strict ER return precautions have been discussed.  Final Clinical Impressions(s) / ED Diagnoses   Final diagnoses:    Acute idiopathic pericarditis    ED Discharge Orders        Ordered    ibuprofen (ADVIL,MOTRIN) 600 MG tablet  Every 6 hours PRN     10/04/17 1413       Derwood Kaplan, MD 11/03/17 1607

## 2019-09-16 IMAGING — CR DG CHEST 2V
2 series · 2 of 2 positions shown · non-contrast
Comparison: None.

CLINICAL DATA: Shortness of breath and chest pain for several days

EXAM:
CHEST - 2 VIEW

[chest pa]
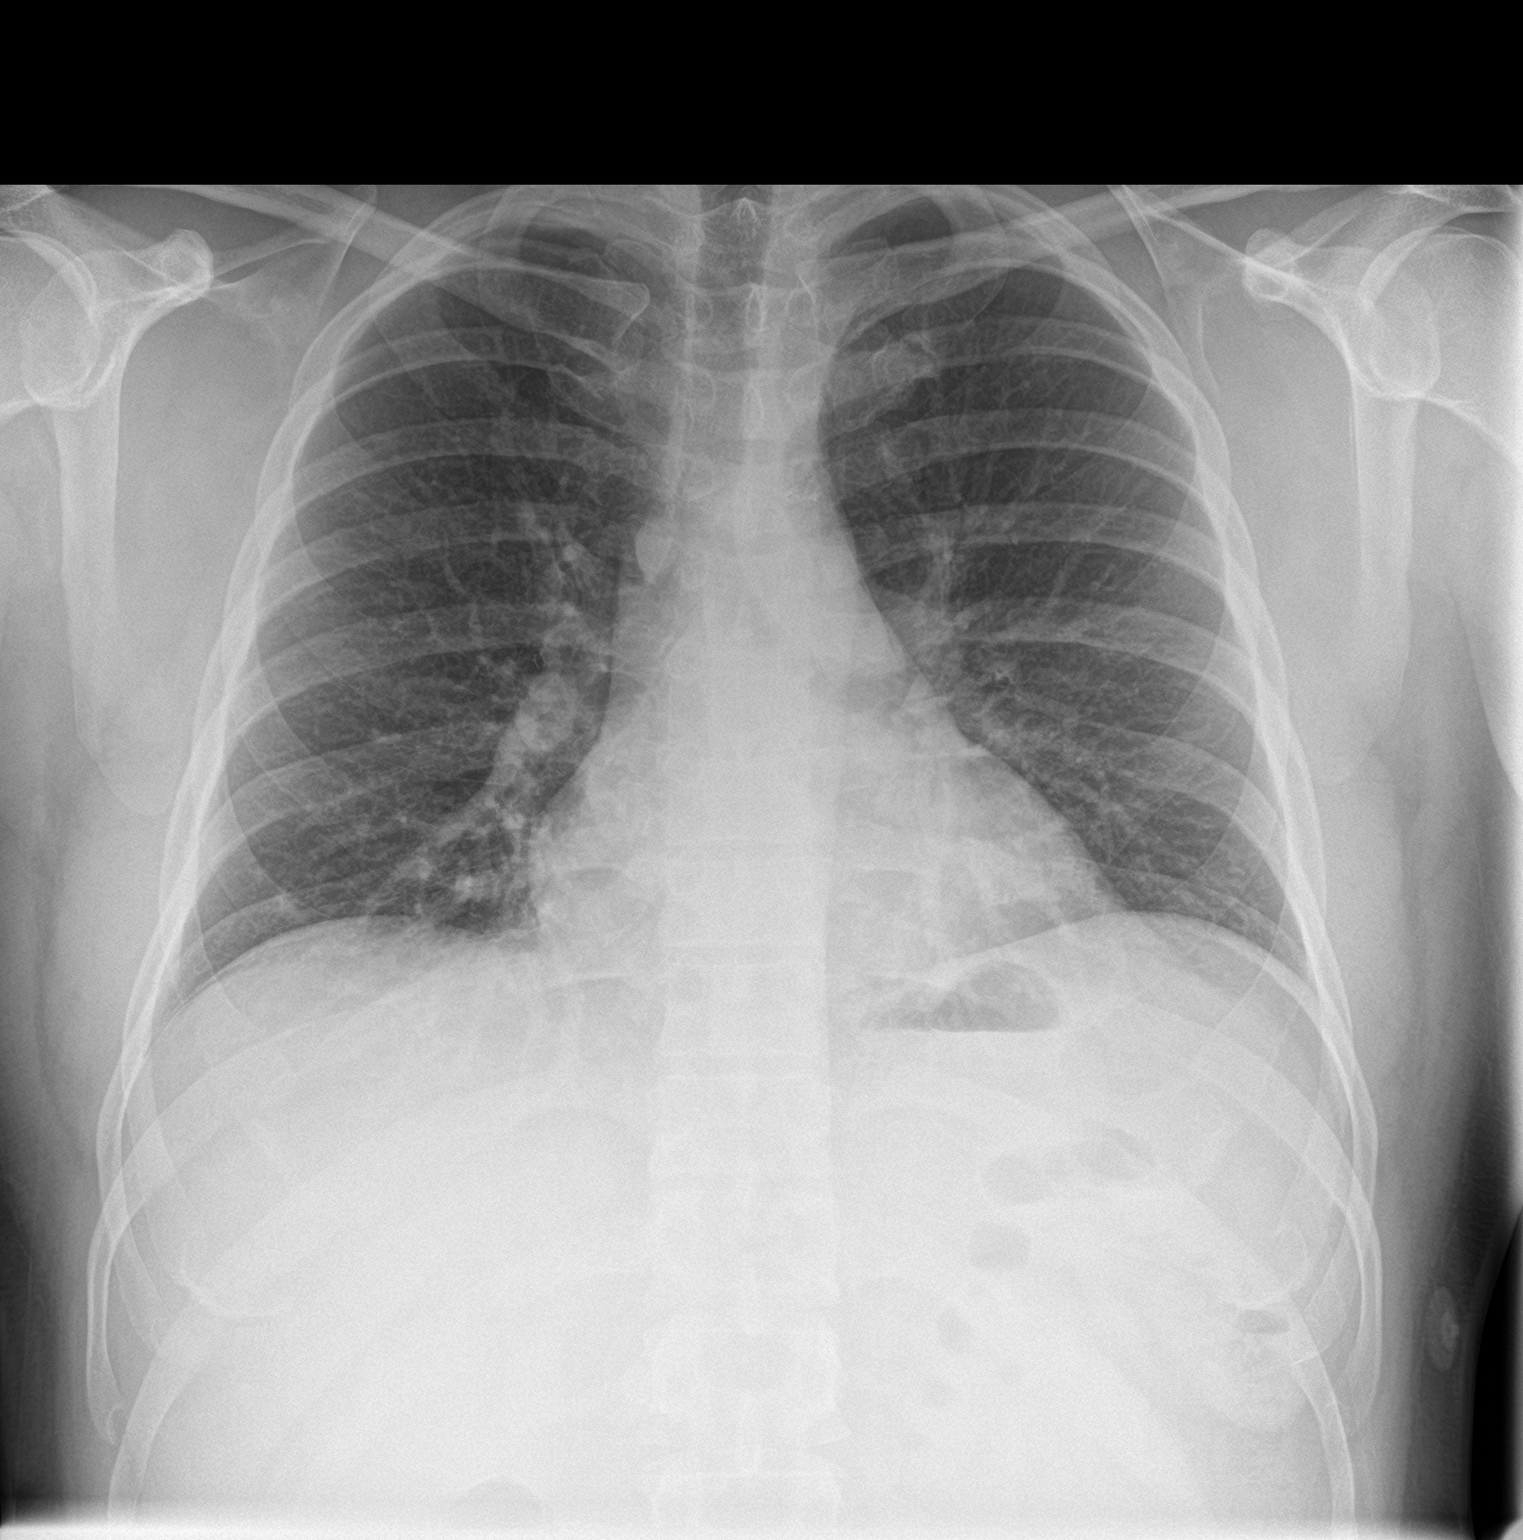

[chest lat]
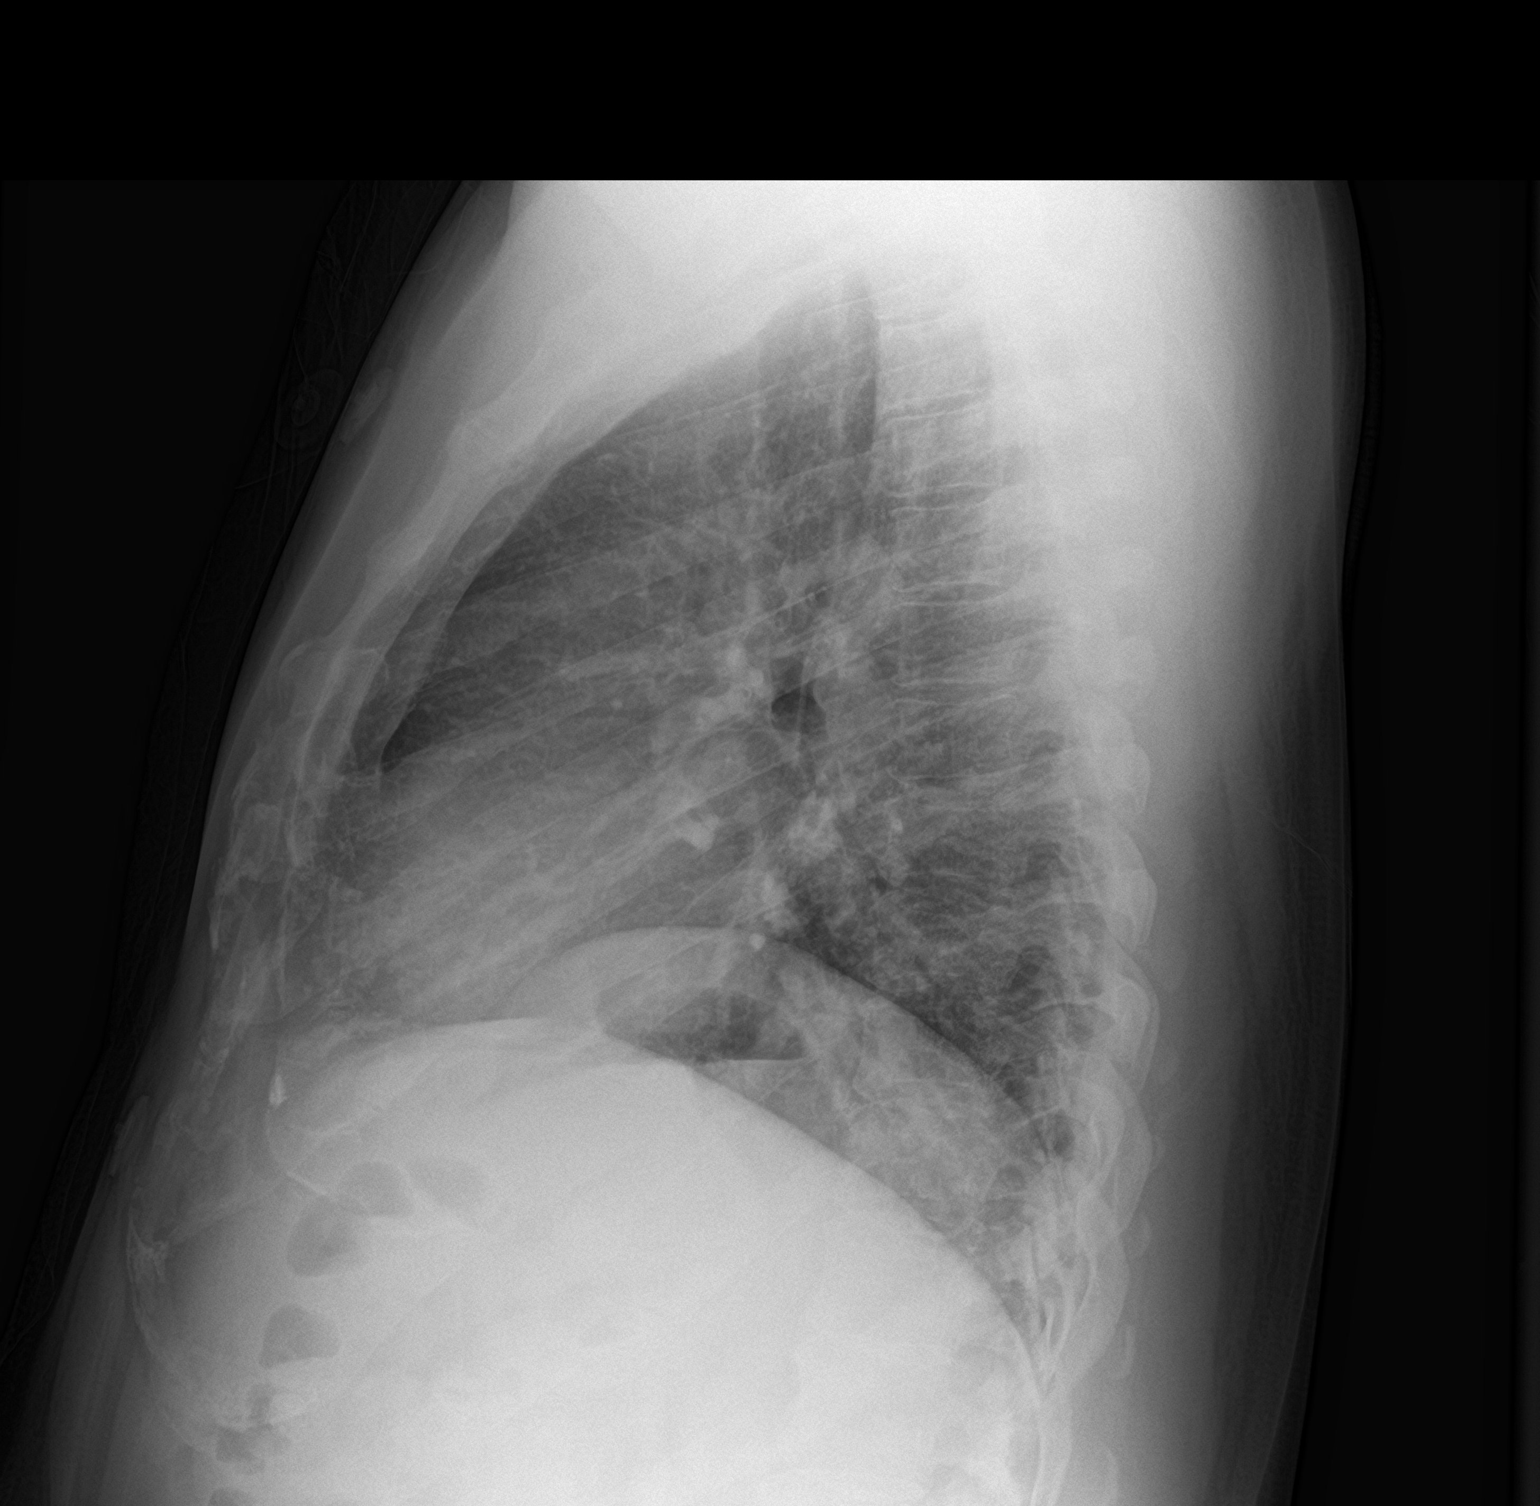

[2 of 2 positions shown; findings below may reference images not displayed]

FINDINGS: The heart size and mediastinal contours are within normal limits.
Both lungs are clear. The visualized skeletal structures are
unremarkable.
IMPRESSION: No active cardiopulmonary disease.

## 2019-09-30 IMAGING — CT CT HEART SCORING
2 series · 16 of 20 positions shown, 18 images · non-contrast
Comparison: None.

CLINICAL DATA: Risk stratification

EXAM:
Coronary Calcium Score
TECHNIQUE: The patient was scanned on a Siemens Force scanner. Axial
non-contrast 3 mm slices were carried out through the heart. The
data set was analyzed on a dedicated work station and scored using
the Agatson method.

[Series 2: casc 3.0 i36f 2 bestdiast 69 % · axial · 0.39mm/px · z∈[-224,-131]mm · 8 of 41 slices shown, 10 images]
[im 5/41  vessel]
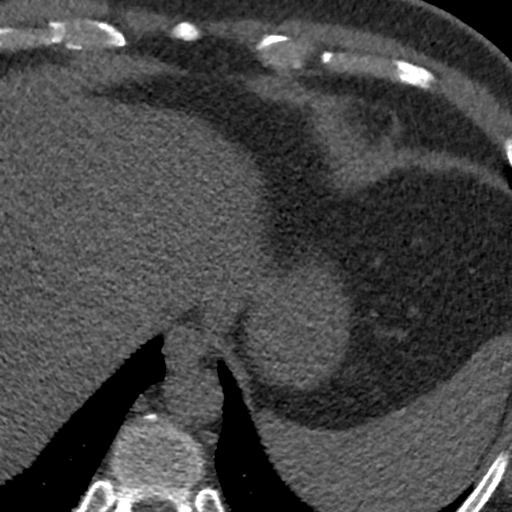
[im 5/41  lung]
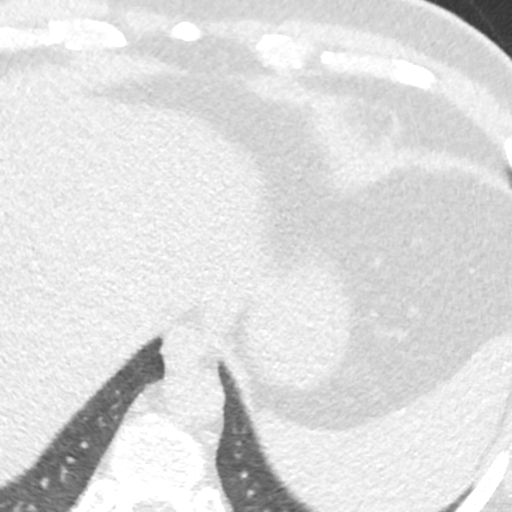
[im 9/41  vessel]
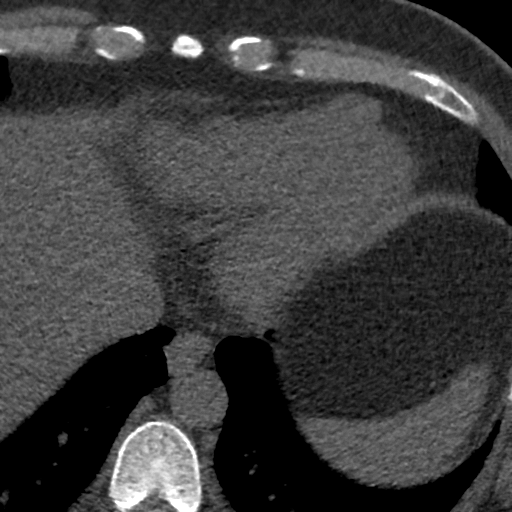
[im 14/41  vessel]
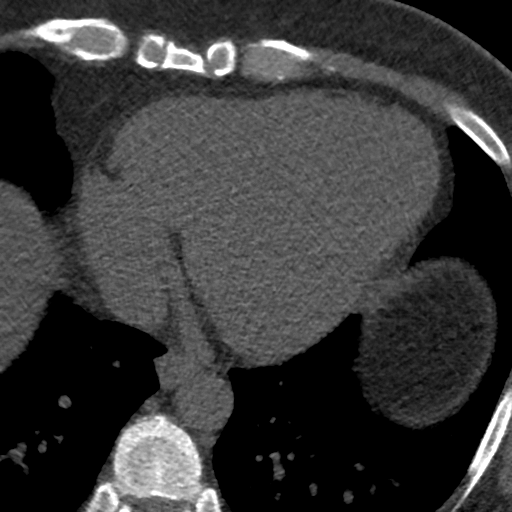
[im 18/41  vessel]
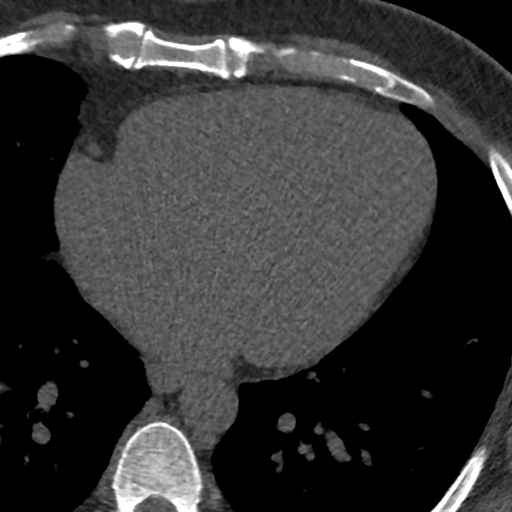
[im 23/41  vessel]
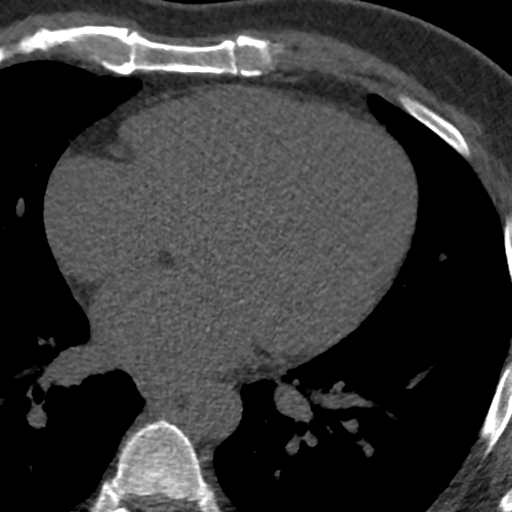
[im 23/41  lung]
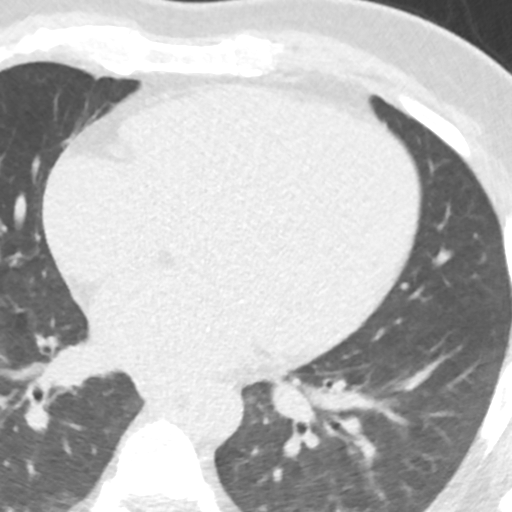
[im 27/41  vessel]
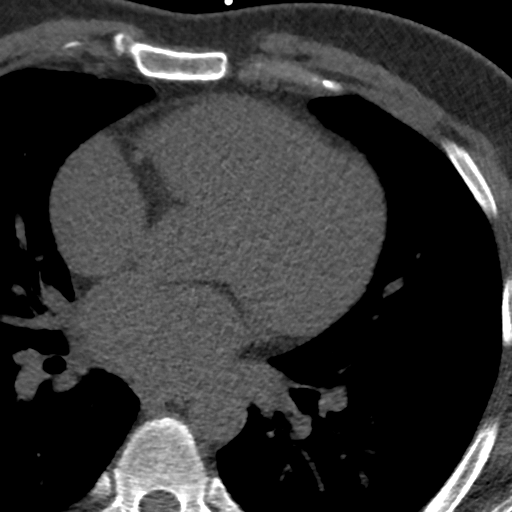
[im 32/41  vessel]
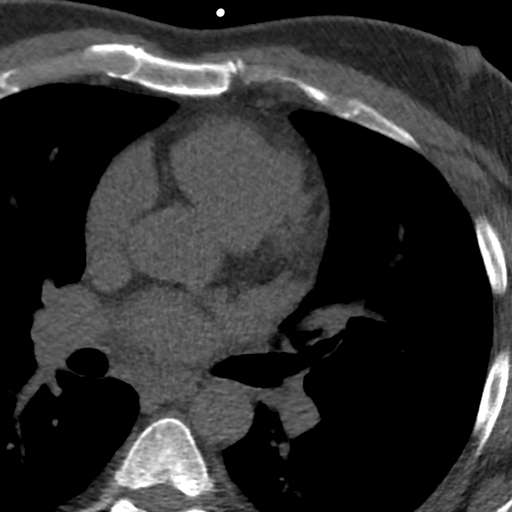
[im 36/41  vessel]
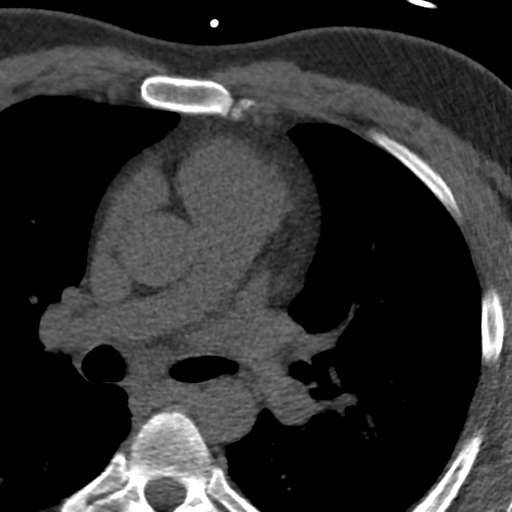

[Series 4: lung st 69 % · axial · 0.71mm/px · z∈[-227,-131]mm · 8 of 42 slices shown]
[im 5/42  lung]
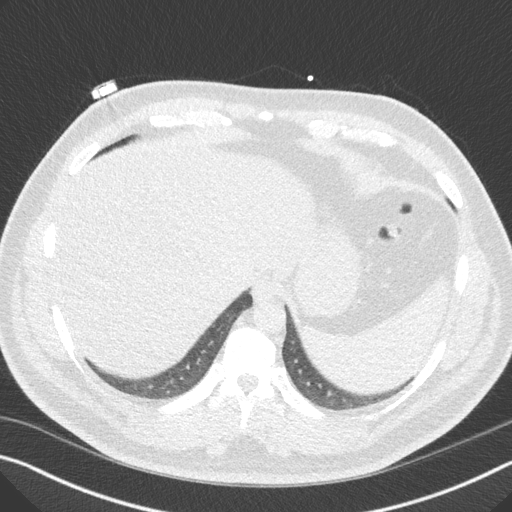
[im 10/42  lung]
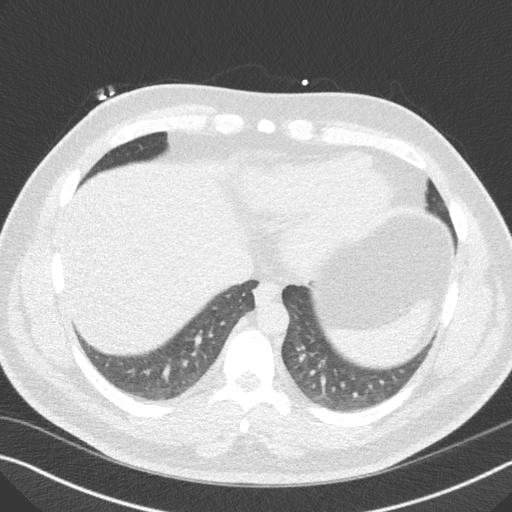
[im 14/42  lung]
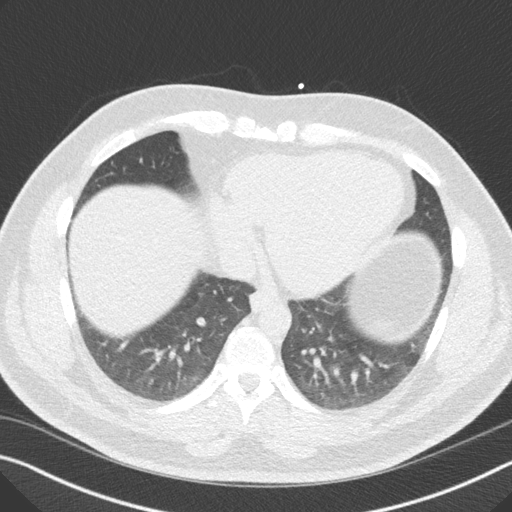
[im 19/42  lung]
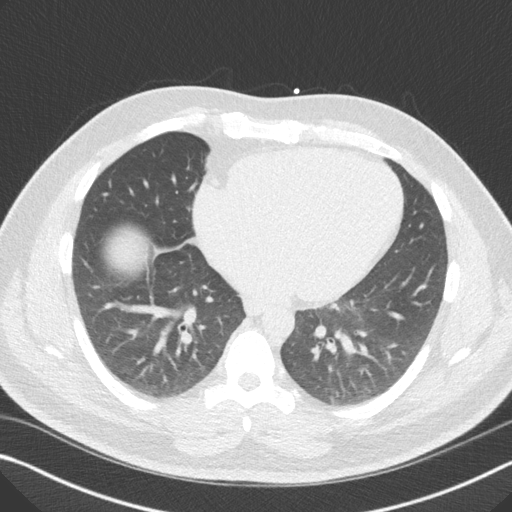
[im 23/42  lung]
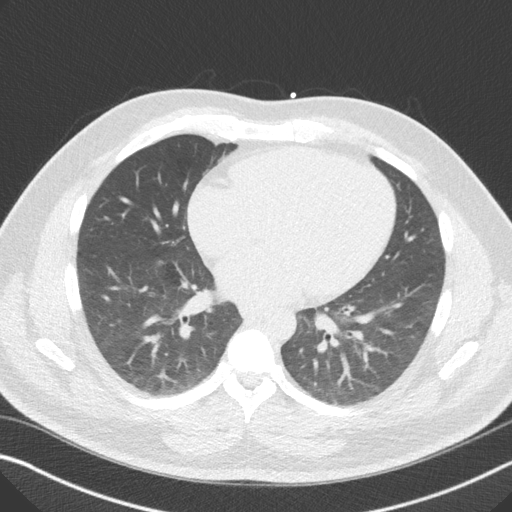
[im 28/42  lung]
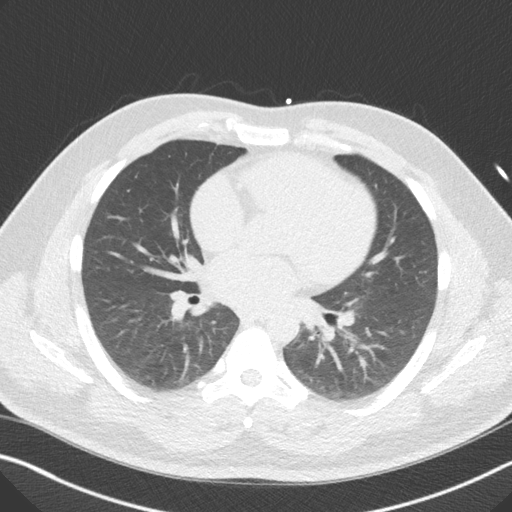
[im 32/42  lung]
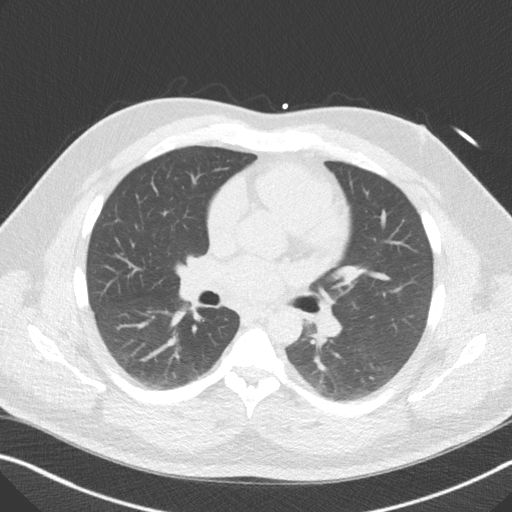
[im 37/42  lung]
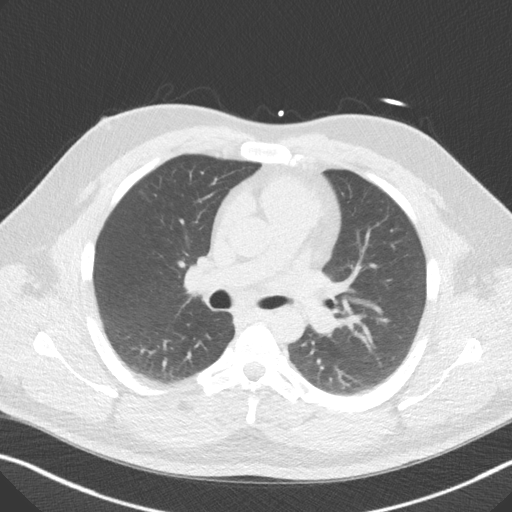

[16 of 20 positions shown; findings below may reference images not displayed]

FINDINGS: Non-cardiac: See separate report from [REDACTED].

Ascending Aorta: Normal size, no calcifications.

Pericardium: Normal.

Coronary arteries: Normal origin.
IMPRESSION: Coronary calcium score of 0. This was 0 percentile for age and sex
matched control.

EXAM:
OVER-READ INTERPRETATION  CT CHEST

The following report is an over-read performed by radiologist Dr.
Azhly Akon [REDACTED] on 10/18/2017. This over-read
does not include interpretation of cardiac or coronary anatomy or
pathology. The coronary calcium score interpretation by the
cardiologist is attached.
FINDINGS: Vascular: Heart is normal size.  Visualized aorta is normal caliber.

Mediastinum/Nodes: No adenopathy in the lower mediastinum or hila.

Lungs/Pleura: Visualize lungs are clear.  No effusions.

Upper Abdomen: Mild fatty infiltration of the liver. No acute
findings.

Musculoskeletal: Chest wall soft tissues are unremarkable. No acute
bony abnormality.
IMPRESSION: No acute extra cardiac abnormality.

Mild fatty infiltration of the visualized liver.

## 2020-03-05 ENCOUNTER — Other Ambulatory Visit: Payer: Self-pay

## 2020-03-05 ENCOUNTER — Ambulatory Visit (INDEPENDENT_AMBULATORY_CARE_PROVIDER_SITE_OTHER): Payer: BC Managed Care – PPO | Admitting: Family Medicine

## 2020-03-05 ENCOUNTER — Encounter: Payer: Self-pay | Admitting: Family Medicine

## 2020-03-05 VITALS — BP 130/90 | HR 68 | Temp 97.4°F | Ht 67.25 in | Wt 205.5 lb

## 2020-03-05 DIAGNOSIS — U071 COVID-19: Secondary | ICD-10-CM | POA: Diagnosis not present

## 2020-03-05 DIAGNOSIS — R9389 Abnormal findings on diagnostic imaging of other specified body structures: Secondary | ICD-10-CM | POA: Diagnosis not present

## 2020-03-05 DIAGNOSIS — J1282 Pneumonia due to coronavirus disease 2019: Secondary | ICD-10-CM

## 2020-03-05 NOTE — Progress Notes (Signed)
John Ray T. Jarrius Huaracha, MD, CAQ Sports Medicine  Primary Care and Sports Medicine [facility] [address] [place], [address]  Phone: [phone]  FAX: [phone]  [name] - 34 y.o. male  MRN [id number]  Date of Birth: April 07, 1986  Date: 03/05/2020  PCP: Hannah Beat, MD  Referral: Hannah Beat, MD  Chief Complaint  Patient presents with  . Hospitalization Follow-up    This visit occurred during the SARS-CoV-2 public health emergency.  Safety protocols were in place, including screening questions prior to the visit, additional usage of staff PPE, and extensive cleaning of exam room while observing appropriate contact time as indicated for disinfecting solutions.   Subjective:   John Ray is a 34 y.o. very pleasant male patient with Body mass index is 31.95 kg/m. who presents with the following:  H/o pericarditis  02/24/2020 tested + for covid-19 Reportedly asymptomatic when called to make his office visit. Sx started 02/21/2020 He is here with his wife who also provides additional history. He was the sickest he has ever been according to both he and his wife.  He did not eat barely anything for about 10 days.  ER at [facility], he ultimately went here and received 2 bags of fluid.  He was dehydrated at the time Feels much better, got AB infusion, but he is still having difficulty breathing.  Over the past 48 hours breathing is getting better.  98% on room air.  Sunday even a break going up and down the stairs.  This is very different from his highly active self. Basic yardwork was hard yesterday, and he got short winded doing this.  Return to work on Monday - limited to desk work only Check next shot after antibodies  Review of Systems is noted in the HPI, as appropriate  Objective:   BP 130/90   Pulse 68   Temp (!) 97.4 F (36.3 C) (Temporal)   Ht 5' 7.25" (1.708 m)    Wt 205 lb 8 oz (93.2 kg)   SpO2 98%   BMI 31.95 kg/m   GEN: No acute distress; alert,appropriate. PULM: Breathing comfortably in no respiratory distress PSYCH: Normally interactive.  CV: RRR, no m/g/r  PULM: Normal respiratory rate, no accessory muscle use. No wheezes, crackles or rhonchi   Laboratory and Imaging Data:  Assessment and Plan:     ICD-10-CM   1. Pneumonia due to COVID-19 virus  U07.1 DG Chest 2 View   J12.82   2. Abnormal chest x-ray  R93.89 DG Chest 2 View   Total encounter time: 30 minutes. This includes total time spent on the day of encounter.  Increased time on record review outside of [place] [facility].  Discussion with the patient and his wife.  I think that he will do well, but he likely will take some time for his lung to recover.  Difficult to know whether this will be on the order of weeks to months.  He has moved to [place], but at this point he would like to see me for his regular health care.  Follow-up: Return in about 6 weeks (around 04/16/2020) for repeat chest x-ray only.  No orders of the defined types were placed in this encounter.  Medications Discontinued During This Encounter  Medication Reason  . ibuprofen (ADVIL,MOTRIN) 600 MG tablet Completed Course   Orders Placed This Encounter  Procedures  . DG Chest 2 View    Signed,  Karleen Hampshire T.  Demarkis Gheen, MD   Outpatient Encounter Medications as of 03/05/2020  Medication Sig  . [DISCONTINUED] ibuprofen (ADVIL,MOTRIN) 600 MG tablet Take 1 tablet (600 mg total) by mouth every 6 (six) hours as needed.   No facility-administered encounter medications on file as of 03/05/2020.

## 2020-03-05 NOTE — Patient Instructions (Signed)
Do not get your next vaccine until 90 days after your Covid infusion

## 2020-03-06 ENCOUNTER — Encounter: Payer: Self-pay | Admitting: Family Medicine

## 2020-08-10 ENCOUNTER — Ambulatory Visit (INDEPENDENT_AMBULATORY_CARE_PROVIDER_SITE_OTHER): Payer: BC Managed Care – PPO | Admitting: Family Medicine

## 2020-08-10 ENCOUNTER — Encounter: Payer: Self-pay | Admitting: Family Medicine

## 2020-08-10 ENCOUNTER — Other Ambulatory Visit: Payer: Self-pay

## 2020-08-10 VITALS — BP 132/90 | HR 68 | Temp 97.8°F | Ht 67.25 in | Wt 214.8 lb

## 2020-08-10 DIAGNOSIS — K922 Gastrointestinal hemorrhage, unspecified: Secondary | ICD-10-CM | POA: Diagnosis not present

## 2020-08-10 DIAGNOSIS — Z13220 Encounter for screening for lipoid disorders: Secondary | ICD-10-CM | POA: Diagnosis not present

## 2020-08-10 DIAGNOSIS — R5383 Other fatigue: Secondary | ICD-10-CM

## 2020-08-10 DIAGNOSIS — Z800 Family history of malignant neoplasm of digestive organs: Secondary | ICD-10-CM | POA: Diagnosis not present

## 2020-08-10 DIAGNOSIS — Z131 Encounter for screening for diabetes mellitus: Secondary | ICD-10-CM | POA: Diagnosis not present

## 2020-08-10 MED ORDER — HYDROCORTISONE (PERIANAL) 2.5 % EX CREA: 1.0000 "application " | TOPICAL_CREAM | Freq: Two times a day (BID) | CUTANEOUS | 2 refills | Status: AC

## 2020-08-10 NOTE — Progress Notes (Signed)
John Alsip T. [name], MD, CAQ Sports Medicine  Primary Care and Sports Medicine [facility] [address] [place], [address]  Phone: [phone]  FAX: [phone]  John Ray - 35 y.o. male  MRN [id number]  Date of Birth: 1986/01/06  Date: 08/10/2020  PCP: John Beat, MD  Referral: John Beat, MD  Chief Complaint  Patient presents with  . Rectal Bleeding    This visit occurred during the SARS-CoV-2 public health emergency.  Safety protocols were in place, including screening questions prior to the visit, additional usage of staff PPE, and extensive cleaning of exam room while observing appropriate contact time as indicated for disinfecting solutions.   Subjective:   John Ray is a 36 y.o. very pleasant male patient with Body mass index is 33.38 kg/m. who presents with the following:  He presents today for evaluation.  He is a well-known patient, now he does live in [place].  He drove up to talk to me about some significant amount of blood that he has been having when he is having a bowel movement and in addition to when he is having basic passing of gas.  By description, and additionally he had picture showing the quantity of blood to me.  He has noted that he has had significant blood even with coagulation.  He has not had any kind of significant pain whatsoever.  He has no known history of hemorrhoids.  He did try some Preparation H to see if this would help, and it has not helped at all.  He is globally quite healthy.  He does have a history of some family with colorectal cancer including his paternal grandmother, and maternal what he thinks is his great-grandmother, but he does not have any relatives in the sense of his siblings or mother and father.  He also has not had any basic lab work since 2019.  He has not established care with a physician in [place].  Has been using prep H.   No hem that he knows about.  PGM M GGM -  No close relatives  [facility] G[facility]enter[facility] and Hepatology  Review of Systems is noted in the HPI, as appropriate  Patient Active Problem List   Diagnosis Date Noted  . Disease of pericardium 10/09/2017  . Family history of coronary artery disease 10/09/2017  . Mixed hyperlipidemia 10/09/2017    Past Medical History:  Diagnosis Date  . 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 Pericarditis     History reviewed. No pertinent surgical history.  Family History  Problem Relation Age of Onset  . Diabetes Mother   . Hyperlipidemia Mother   . Heart attack Mother   . Heart disease Father   . Diabetes Father   . Heart attack Father   . ALS Maternal Grandmother   . Dementia Paternal Grandmother   . Colon cancer Paternal Grandmother   . Heart attack Paternal Grandfather      Objective:   BP 132/90   Pulse 68   Temp 97.8 F (36.6 C) (Temporal)   Ht 5' 7.25" (1.708 m)   Wt 214 lb 12 oz (97.4 kg)   SpO2 97%   BMI 33.38 kg/m   GEN: No acute distress; alert,appropriate. PULM: Breathing comfortably in no respiratory distress PSYCH: Normally interactive.  CV: RRR, no m/g/r  ABD: S, NT, ND, + BS, No rebound, No HSM  Rectal: He  does have a small inferior rectal hemorrhoid exteriorly in approximately the 5 o'clock position.  Good rectal tone.  I do think I also appreciate a small internal hemorrhoid in the same region.  He does not have thrombosis, and there is no anal fissure.   Laboratory and Imaging Data:  Assessment and Plan:     ICD-10-CM   1. Lower GI bleed  K92.2 CBC with Differential/Platelet  2. Family history of colorectal cancer  Z80.0   3. Other fatigue  R53.83 Basic metabolic panel    Hepatic function panel    TSH    Vitamin B12  4. Screening, lipid  Z13.220 Lipid panel  5. Screening for diabetes mellitus  Z13.1 Basic metabolic panel    Hemoglobin A1c    Significant rectal bleeding without known source.  Does have a small hemorrhoid.  He is having this regularly, even with loculation.  I think that this deserves further inquiry.  I am going to consult [facility], since he now lives in this region.  No basic labs since 2019, we will reorder these today.  He does understand that if he has a frank bleed with severe increased blood, then he would have to have this evaluated emergently.  Globally, he is a very healthy patient at age 32 with no medical problems.  Meds ordered this encounter  Medications  . hydrocortisone (ANUSOL-HC) 2.5 % rectal cream    Sig: Place 1 application rectally 2 (two) times daily.    Dispense:  30 g    Refill:  2   There are no discontinued medications. Orders Placed This Encounter  Procedures  . Basic metabolic panel  . CBC with Differential/Platelet  . Hepatic function panel  . TSH  . Lipid panel  . Vitamin B12  . Hemoglobin A1c    Follow-up: No follow-ups on file.  Signed,  Elpidio Galea. Chaniah Cisse, MD   Outpatient Encounter Medications as of 08/10/2020  Medication Sig  . hydrocortisone (ANUSOL-HC) 2.5 % rectal cream Place 1 application rectally 2 (two) times daily.  Marland Kitchen ketoconazole (NIZORAL) 2 % shampoo Apply topically.   No facility-administered encounter medications on file as of 08/10/2020.

## 2020-08-11 LAB — LIPID PANEL
Cholesterol: 201 mg/dL — ABNORMAL HIGH (ref 0–200)
HDL: 54.9 mg/dL (ref >39.00)
LDL Cholesterol: 112 mg/dL — ABNORMAL HIGH (ref 0–99)
NonHDL: 145.92
Total CHOL/HDL Ratio: 4
Triglycerides: 172 mg/dL — ABNORMAL HIGH (ref 0.0–149.0)
VLDL: 34.4 mg/dL (ref 0.0–40.0)

## 2020-08-11 LAB — BASIC METABOLIC PANEL
BUN: 14 mg/dL (ref 6–23)
CO2: 31 mEq/L (ref 19–32)
Calcium: 9.7 mg/dL (ref 8.4–10.5)
Chloride: 103 mEq/L (ref 96–112)
Creatinine, Ser: 0.89 mg/dL (ref 0.40–1.50)
GFR: 110.27 mL/min (ref >60.00)
Glucose, Bld: 78 mg/dL (ref 70–99)
Potassium: 4.2 mEq/L (ref 3.5–5.1)
Sodium: 140 mEq/L (ref 135–145)

## 2020-08-11 LAB — VITAMIN B12: Vitamin B-12: 238 pg/mL (ref 211–911)

## 2020-08-11 LAB — CBC WITH DIFFERENTIAL/PLATELET
Basophils Absolute: 0.1 10*3/uL (ref 0.0–0.1)
Basophils Relative: 0.8 % (ref 0.0–3.0)
Eosinophils Absolute: 0.1 10*3/uL (ref 0.0–0.7)
Eosinophils Relative: 2 % (ref 0.0–5.0)
HCT: 42.7 % (ref 39.0–52.0)
Hemoglobin: 14 g/dL (ref 13.0–17.0)
Lymphocytes Relative: 30.6 % (ref 12.0–46.0)
Lymphs Abs: 2.1 10*3/uL (ref 0.7–4.0)
MCHC: 32.8 g/dL (ref 30.0–36.0)
MCV: 83.8 fl (ref 78.0–100.0)
Monocytes Absolute: 0.8 10*3/uL (ref 0.1–1.0)
Monocytes Relative: 11.9 % (ref 3.0–12.0)
Neutro Abs: 3.8 10*3/uL (ref 1.4–7.7)
Neutrophils Relative %: 54.7 % (ref 43.0–77.0)
Platelets: 202 10*3/uL (ref 150.0–400.0)
RBC: 5.1 Mil/uL (ref 4.22–5.81)
RDW: 13.6 % (ref 11.5–15.5)
WBC: 7 10*3/uL (ref 4.0–10.5)

## 2020-08-11 LAB — TSH: TSH: 1.86 u[IU]/mL (ref 0.35–4.50)

## 2020-08-11 LAB — HEPATIC FUNCTION PANEL
ALT: 24 U/L (ref 0–53)
AST: 19 U/L (ref 0–37)
Albumin: 4.7 g/dL (ref 3.5–5.2)
Alkaline Phosphatase: 68 U/L (ref 39–117)
Bilirubin, Direct: 0.1 mg/dL (ref 0.0–0.3)
Total Bilirubin: 0.7 mg/dL (ref 0.2–1.2)
Total Protein: 7.6 g/dL (ref 6.0–8.3)

## 2020-08-11 LAB — HEMOGLOBIN A1C: Hgb A1c MFr Bld: 5.2 % (ref 4.6–6.5)
